# Patient Record
Sex: Female | Born: 1950 | Race: Black or African American | Hispanic: No | Marital: Married | Smoking: Never smoker
Health system: Southern US, Community
[De-identification: ages and names within clinical notes are randomized; demographics above are authoritative.]

## PROBLEM LIST (undated history)

## (undated) DIAGNOSIS — T8859XA Other complications of anesthesia, initial encounter: Secondary | ICD-10-CM

## (undated) DIAGNOSIS — I10 Essential (primary) hypertension: Secondary | ICD-10-CM

## (undated) DIAGNOSIS — R112 Nausea with vomiting, unspecified: Secondary | ICD-10-CM

## (undated) DIAGNOSIS — C50919 Malignant neoplasm of unspecified site of unspecified female breast: Secondary | ICD-10-CM

## (undated) DIAGNOSIS — Z923 Personal history of irradiation: Secondary | ICD-10-CM

## (undated) DIAGNOSIS — T4145XA Adverse effect of unspecified anesthetic, initial encounter: Secondary | ICD-10-CM

## (undated) DIAGNOSIS — H8109 Meniere's disease, unspecified ear: Secondary | ICD-10-CM

## (undated) DIAGNOSIS — Z9889 Other specified postprocedural states: Secondary | ICD-10-CM

## (undated) HISTORY — DX: Meniere's disease, unspecified ear: H81.09

## (undated) HISTORY — DX: Personal history of irradiation: Z92.3

## (undated) HISTORY — PX: BREAST SURGERY: SHX581

## (undated) HISTORY — PX: INNER EAR SURGERY: SHX679

## (undated) HISTORY — DX: Malignant neoplasm of unspecified site of unspecified female breast: C50.919

## (undated) HISTORY — PX: CHOLECYSTECTOMY: SHX55

## (undated) HISTORY — PX: APPENDECTOMY: SHX54

---

## 2011-10-18 ENCOUNTER — Other Ambulatory Visit: Payer: Self-pay | Admitting: Family Medicine

## 2011-10-18 DIAGNOSIS — R921 Mammographic calcification found on diagnostic imaging of breast: Secondary | ICD-10-CM

## 2011-10-25 ENCOUNTER — Ambulatory Visit
Admission: RE | Admit: 2011-10-25 | Discharge: 2011-10-25 | Disposition: A | Discharge status: Home / Self Care | Payer: BC Managed Care – PPO | Source: Ambulatory Visit | Attending: Family Medicine | Admitting: Family Medicine

## 2011-10-25 DIAGNOSIS — R921 Mammographic calcification found on diagnostic imaging of breast: Secondary | ICD-10-CM

## 2011-10-25 DIAGNOSIS — C50919 Malignant neoplasm of unspecified site of unspecified female breast: Secondary | ICD-10-CM

## 2011-10-25 HISTORY — DX: Malignant neoplasm of unspecified site of unspecified female breast: C50.919

## 2011-10-26 ENCOUNTER — Other Ambulatory Visit: Payer: Self-pay | Admitting: Family Medicine

## 2011-10-26 DIAGNOSIS — C50911 Malignant neoplasm of unspecified site of right female breast: Secondary | ICD-10-CM

## 2011-10-30 ENCOUNTER — Other Ambulatory Visit: Payer: Self-pay | Admitting: *Deleted

## 2011-10-30 ENCOUNTER — Telehealth: Payer: Self-pay | Admitting: *Deleted

## 2011-10-30 DIAGNOSIS — D0510 Intraductal carcinoma in situ of unspecified breast: Secondary | ICD-10-CM

## 2011-10-30 NOTE — Telephone Encounter (Signed)
Confirmed [facility] for 11/07/11 at 1215 .  Instructions and contact information given.  

## 2011-11-02 ENCOUNTER — Ambulatory Visit
Admission: RE | Admit: 2011-11-02 | Discharge: 2011-11-02 | Disposition: A | Discharge status: Home / Self Care | Payer: BC Managed Care – PPO | Source: Ambulatory Visit | Attending: Family Medicine | Admitting: Family Medicine

## 2011-11-02 DIAGNOSIS — C50911 Malignant neoplasm of unspecified site of right female breast: Secondary | ICD-10-CM

## 2011-11-02 MED ORDER — GADOBENATE DIMEGLUMINE 529 MG/ML IV SOLN
15.0000 mL | Freq: Once | INTRAVENOUS | Status: AC | PRN
  Administered 2011-11-02: 15 mL via INTRAVENOUS

## 2011-11-07 ENCOUNTER — Ambulatory Visit: Payer: BC Managed Care – PPO

## 2011-11-07 ENCOUNTER — Encounter: Payer: Self-pay | Admitting: Oncology

## 2011-11-07 ENCOUNTER — Encounter (INDEPENDENT_AMBULATORY_CARE_PROVIDER_SITE_OTHER): Payer: Self-pay | Admitting: General Surgery

## 2011-11-07 ENCOUNTER — Ambulatory Visit (HOSPITAL_BASED_OUTPATIENT_CLINIC_OR_DEPARTMENT_OTHER): Payer: BC Managed Care – PPO | Admitting: General Surgery

## 2011-11-07 ENCOUNTER — Telehealth: Payer: Self-pay | Admitting: Oncology

## 2011-11-07 ENCOUNTER — Other Ambulatory Visit (HOSPITAL_BASED_OUTPATIENT_CLINIC_OR_DEPARTMENT_OTHER): Payer: BC Managed Care – PPO | Admitting: Lab

## 2011-11-07 ENCOUNTER — Ambulatory Visit
Admission: RE | Admit: 2011-11-07 | Discharge: 2011-11-07 | Disposition: A | Discharge status: Home / Self Care | Payer: BC Managed Care – PPO | Source: Ambulatory Visit | Attending: Radiation Oncology | Admitting: Radiation Oncology

## 2011-11-07 ENCOUNTER — Ambulatory Visit (HOSPITAL_BASED_OUTPATIENT_CLINIC_OR_DEPARTMENT_OTHER): Payer: BC Managed Care – PPO | Admitting: Oncology

## 2011-11-07 ENCOUNTER — Ambulatory Visit: Payer: BC Managed Care – PPO | Attending: General Surgery | Admitting: Physical Therapy

## 2011-11-07 VITALS — BP 164/104 | HR 64 | Temp 98.3°F | Ht 64.0 in | Wt 174.0 lb

## 2011-11-07 DIAGNOSIS — C50419 Malignant neoplasm of upper-outer quadrant of unspecified female breast: Secondary | ICD-10-CM

## 2011-11-07 DIAGNOSIS — D0590 Unspecified type of carcinoma in situ of unspecified breast: Secondary | ICD-10-CM | POA: Insufficient documentation

## 2011-11-07 DIAGNOSIS — D0510 Intraductal carcinoma in situ of unspecified breast: Secondary | ICD-10-CM

## 2011-11-07 DIAGNOSIS — Z510 Encounter for antineoplastic radiation therapy: Secondary | ICD-10-CM | POA: Insufficient documentation

## 2011-11-07 DIAGNOSIS — Z170 Estrogen receptor positive status [ER+]: Secondary | ICD-10-CM | POA: Insufficient documentation

## 2011-11-07 DIAGNOSIS — Z79899 Other long term (current) drug therapy: Secondary | ICD-10-CM | POA: Insufficient documentation

## 2011-11-07 DIAGNOSIS — Z803 Family history of malignant neoplasm of breast: Secondary | ICD-10-CM | POA: Insufficient documentation

## 2011-11-07 DIAGNOSIS — C50411 Malignant neoplasm of upper-outer quadrant of right female breast: Secondary | ICD-10-CM | POA: Diagnosis present

## 2011-11-07 DIAGNOSIS — Z800 Family history of malignant neoplasm of digestive organs: Secondary | ICD-10-CM | POA: Insufficient documentation

## 2011-11-07 DIAGNOSIS — C50519 Malignant neoplasm of lower-outer quadrant of unspecified female breast: Secondary | ICD-10-CM

## 2011-11-07 DIAGNOSIS — C50919 Malignant neoplasm of unspecified site of unspecified female breast: Secondary | ICD-10-CM | POA: Insufficient documentation

## 2011-11-07 DIAGNOSIS — IMO0001 Reserved for inherently not codable concepts without codable children: Secondary | ICD-10-CM | POA: Insufficient documentation

## 2011-11-07 LAB — COMPREHENSIVE METABOLIC PANEL
Albumin: 3.7 g/dL (ref 3.5–5.2)
BUN: 12 mg/dL (ref 6–23)
Calcium: 9.3 mg/dL (ref 8.4–10.5)
Chloride: 103 mEq/L (ref 96–112)
Creatinine, Ser: 0.72 mg/dL (ref 0.50–1.10)
Glucose, Bld: 89 mg/dL (ref 70–99)
Potassium: 3.3 mEq/L — ABNORMAL LOW (ref 3.5–5.3)

## 2011-11-07 LAB — CBC WITH DIFFERENTIAL/PLATELET
Basophils Absolute: 0 10*3/uL (ref 0.0–0.1)
Eosinophils Absolute: 0.2 10*3/uL (ref 0.0–0.5)
HCT: 40 % (ref 34.8–46.6)
HGB: 13.6 g/dL (ref 11.6–15.9)
MCH: 29.3 pg (ref 25.1–34.0)
MCV: 86.4 fL (ref 79.5–101.0)
MONO%: 6.7 % (ref 0.0–14.0)
NEUT#: 5.4 10*3/uL (ref 1.5–6.5)
NEUT%: 55 % (ref 38.4–76.8)
RDW: 13.8 % (ref 11.2–14.5)
lymph#: 3.5 10*3/uL — ABNORMAL HIGH (ref 0.9–3.3)

## 2011-11-07 NOTE — Telephone Encounter (Signed)
pt rtn call and confirm appts for 11/30 and 12/06 °

## 2011-11-07 NOTE — Progress Notes (Signed)
Patient ID: Penny Foster, female   DOB: Sep 07, 1951, 60 y.o.   MRN: [id number]  Chief Complaint  Patient presents with  . Other    breast cancer    HPI Penny Foster is a 60 y.o. female.  Referred by Dr. Christiana Pellant HPI This is a 48 yof with no specific breast complaints who underwent a screening mammogram recently. This showed developing heterogeneous calcifications in the upper outer right breast.  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  This is ER positive at 99% and PR positive at 88%.  MRI showed 2 cm region of clumped nodular enhancement in the right breast at nine o'clock extending posteriorly.  She comes in today to discuss options.  Past Medical History  Diagnosis Date  . Meniere disease     Past Surgical History  Procedure Date  . Inner ear surgery     History reviewed. No pertinent family history.  Social History History  Substance Use Topics  . Smoking status: Never Smoker   . Smokeless tobacco: Not on file  . Alcohol Use: No    No Known Allergies  Current Outpatient Prescriptions  Medication Sig Dispense Refill  . diltiazem (DILACOR XR) 180 MG 24 hr capsule       . hydrochlorothiazide (HYDRODIURIL) 25 MG tablet         Review of Systems Review of Systems  Constitutional: Negative for fever, chills and unexpected weight change.  HENT: Negative for hearing loss, congestion, sore throat, trouble swallowing and voice change.   Eyes: Negative for visual disturbance.  Respiratory: Negative for cough and wheezing.   Cardiovascular: Negative for chest pain, palpitations and leg swelling.  Gastrointestinal: Negative for nausea, vomiting, abdominal pain, diarrhea, constipation, blood in stool, abdominal distention and anal bleeding.  Genitourinary: Negative for hematuria, vaginal bleeding and difficulty urinating.  Musculoskeletal: Negative for arthralgias.  Skin: Negative for rash and wound.  Neurological:  Negative for seizures, syncope and headaches.  Hematological: Negative for adenopathy. Does not bruise/bleed easily.  Psychiatric/Behavioral: Negative for confusion.    There were no vitals taken for this visit.  Physical Exam Physical Exam  Constitutional: She appears well-developed and well-nourished.  Neck: Neck supple.  Cardiovascular: Normal rate, regular rhythm and normal heart sounds.   Pulmonary/Chest: Effort normal and breath sounds normal. She has no wheezes. She has no rales. Right breast exhibits no inverted nipple, no mass, no nipple discharge, no skin change and no tenderness. Left breast exhibits no inverted nipple, no mass, no nipple discharge, no skin change and no tenderness. Breasts are symmetrical.  Abdominal: Soft. There is no hepatomegaly.  Lymphadenopathy:    She has no cervical adenopathy.    She has no axillary adenopathy.       Right: No supraclavicular adenopathy present.       Left: No supraclavicular adenopathy present.    Data Reviewed BILATERAL BREAST MRI WITH AND WITHOUT CONTRAST  Technique: Multiplanar, multisequence MR images of both breasts  were obtained prior to and following the intravenous administration  of 15ml of Multihance. Three dimensional images were evaluated at  the independent DynaCad workstation.  Comparison: [facility] mammograms and post biopsy right  diagnostic mammogram at [facility]  Findings: No abnormal T2-weighted hyperintensity or lymphadenopathy  is seen on either side. At the site of biopsy-proven right breast  nine o'clock location DCIS, with internal clip artifact, there are  post biopsy changes with smooth rim enhancement of the biopsy  tract.  There is clumped nodular enhancement extending 2 cm  posterior to the clip artifact, demonstrating plateau type  enhancement kinetics. This abnormal enhancement corresponds to  mammographically evident calcifications extending posterior to the    bullet shaped biopsy clip. No other area of abnormal enhancement  is seen on either side. There is minimal background parenchymal  enhancement type pattern.  IMPRESSION:  2 cm region of abnormal clumped nodular enhancement in the right  breast nine o'clock location at the site of biopsy-proven DCIS,  extending posteriorly from the biopsy clip and corresponding to  mammographically evident calcifications. Attention to this  morphology is recommended at the time of needle localization, with  possible bracketing.  No other area of abnormal enhancement to suggest  multifocal/multicentric or contralateral malignancy.    Assessment    Right breast DCIS    Plan     We discussed the staging and pathophysiology of breast cancer. We discussed all of the different options for treatment for breast cancer including surgery, chemotherapy, radiation therapy, Herceptin, and antiestrogen therapy.   We discussed a sentinel lymph node biopsy as she has some high risk features including comedo necrosis and high grade.  But this does look about 2 cm.  She is right handed and we discussed possibility of node biopsy. After a long discussion we have decided to not pursue sentinel node biopsy at this time.  She understands that she may need to return to operating room if any invasive disease on her specimen. We discussed the options for treatment of the breast cancer which included lumpectomy versus a mastectomy. We discussed the performance of the lumpectomy with a wire placement. We discussed a 10-20% chance of a positive margin requiring reexcision in the operating room. We also discussed that she may need radiation therapy or antiestrogen therapy or both if she undergoes lumpectomy. We discussed the mastectomy and the postoperative care for that as well. We discussed that there is no difference in her survival whether she undergoes lumpectomy with radiation therapy or antiestrogen therapy versus a mastectomy.  There is a slight difference in the local recurrence rate being 3-5% with lumpectomy and about 1% with a mastectomy. We discussed the risks of operation including bleeding, infection, possible reoperation. She understands her further therapy will be based on what her stages at the time of her operation. After discussion, plan for wire guided lumpectomy.          Shaarav Ripple 11/07/2011, 4:43 PM

## 2011-11-07 NOTE — Progress Notes (Signed)
Penny Foster    CC:  Penny Foster is a 60 year old [place] woman referred by Dr. Christiana Foster for evaluation in the setting of newly diagnosed breast cancer.  HPI: The patient had a screening mammogram at [facility] in October 2012 showing new calcifications in the right breast. Repeat right diagnostic mammography on 10/04/2011 showed a cluster of heterogeneous calcifications spanning 2.4 cm. 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  With this information the patient was referred for bilateral breast MRI, performed 10/31/2011. This showed a 2 cm area of abnormal clumped nodular enhancement in the right breast 9:00 location extending posteriorly from the biopsy clip. There were no other areas of abnormal enhancement and no evidence of lymph node involvement.  The patient's case was presented this morning at the breast cancer conference, and was seen today at the multidisciplinary breast cancer clinic to develop a definitive treatment plan.  Interval History:  Past Medical History  Diagnosis Date  . Meniere disease     Past Surgical History  Procedure Date  . Inner ear surgery     Family History: The patient's father died at the age of 25; her mother died at the age of 30. She has 4 brothers and 5 sisters. One sister had stomach cancer, one brother had lung cancer. There is no history of breast or ovarian cancer in the family.  Gynecologic history: Menarche age 62, first pregnancy to term age 24. She is GX P2. She does not recall how long ago she stopped having periods. She never used hormone replacement or birth control pills.  Social History: She works as Research officer, political party. Her husband Penny Foster is a retired Art gallery manager although he a rose to be a Warehouse manager of the  company. Son Penny Foster lives in [place] where he works in [employer]. Daughter Penny Foster is Interior and spatial designer of the [employer] [employer] [facility] group home for disabled adults program. The patient has one grandchild. She attends the [employer],  Health maintenance: The patient  reports that she has never smoked. She does not have any smokeless tobacco history on file. She reports that she does not drink alcohol. Her drug history not on file.    Cholesterol --  Bone density -- performed 2007; pt. Will bring records next visit  Colonoscopy --never  (PAP) OCT 2012   Allergies: No Known Allergies     Current Outpatient Prescriptions  Medication Sig Dispense Refill  . diltiazem (DILACOR XR) 180 MG 24 hr capsule       . hydrochlorothiazide (HYDRODIURIL) 25 MG tablet         ROS she was not aware of any symptoms prior to her mammogram that might suggest breast cancer. A detailed review of systems today was otherwise entirely negative.  Physical Exam: She is a middle-aged [place] American woman who appears comfortable.  Blood pressure 164/104, pulse 64, temperature 98.3 F (36.8 C), temperature source Oral, height 5\' 4"  (1.626 m), weight 174 lb (78.926 kg). BMI is 29.9.  Sclerae unicteric Oropharynx clear No peripheral adenopathy and specifically no right axillary adenopathy palpable Lungs no rales or rhonchi Heart regular rate and rhythm Abd benign MSK no focal spinal tenderness, no peripheral edema Neuro: nonfocal Breasts: Right breast status post recent lumpectomy. There are no significant skin changes or nipple retraction. Left breast no suspicious  masses  LABS   Results for orders placed in visit on 11/07/11 (from the past 48 hour(s))  CBC WITH DIFFERENTIAL     Status: Abnormal   Collection Time   11/07/11 12:13 PM      Component Value Range Comment   WBC 9.8  3.9 - 10.3 (10e3/uL)    NEUT# 5.4  1.5 - 6.5 (10e3/uL)    HGB 13.6  11.6 - 15.9 (g/dL)    HCT 16.1  09.6 - 04.5 (%)     Platelets 325  145 - 400 (10e3/uL)    MCV 86.4  79.5 - 101.0 (fL)    MCH 29.3  25.1 - 34.0 (pg)    MCHC 33.9  31.5 - 36.0 (g/dL)    RBC 4.09  8.11 - 9.14 (10e6/uL)    RDW 13.8  11.2 - 14.5 (%)    lymph# 3.5 (*) 0.9 - 3.3 (10e3/uL)    MONO# 0.7  0.1 - 0.9 (10e3/uL)    Eosinophils Absolute 0.2  0.0 - 0.5 (10e3/uL)    Basophils Absolute 0.0  0.0 - 0.1 (10e3/uL)    NEUT% 55.0  38.4 - 76.8 (%)    LYMPH% 36.2  14.0 - 49.7 (%)    MONO% 6.7  0.0 - 14.0 (%)    EOS% 1.6  0.0 - 7.0 (%)    BASO% 0.5  0.0 - 2.0 (%)   COMPREHENSIVE METABOLIC PANEL     Status: Abnormal   Collection Time   11/07/11 12:13 PM      Component Value Range Comment   Sodium 142  135 - 145 (mEq/L)    Potassium 3.3 (*) 3.5 - 5.3 (mEq/L)    Chloride 103  96 - 112 (mEq/L)    CO2 29  19 - 32 (mEq/L)    Glucose, Bld 89  70 - 99 (mg/dL)    BUN 12  6 - 23 (mg/dL)    Creatinine, Ser 7.82  0.50 - 1.10 (mg/dL)    Total Bilirubin 0.3  0.3 - 1.2 (mg/dL)    Alkaline Phosphatase 81  39 - 117 (U/L)    AST 17  0 - 37 (U/L)    ALT 16  0 - 35 (U/L)    Total Protein 7.9  6.0 - 8.3 (g/dL)    Albumin 3.7  3.5 - 5.2 (g/dL)    Calcium 9.3  8.4 - 10.5 (mg/dL)        FILMS:  No results found.   Assessment: 60 year old [name] woman status post right breast biopsy 10/25/2011 for a high-grade ductal carcinoma in situ which was strongly estrogen and progesterone receptor positive.    Plan: We discussed her situation at length, and she understands that a noninvasive tumor is by definition not life threatening. She understands accordingly that there is no survival difference between lumpectomy plus radiation and mastectomy. She is very interested in breast preservation, and that is the current plan, with surgery anticipated for next week.  Postoperative radiation will decrease the risk of local recurrence. Antiestrogen therapy will further lower the risk of local recurrence. It will also cut in half the risk of a new  breast cancer developing in either breast. For these reasons of I. feel comfortable recommending 5 years of antiestrogen therapy to Ms. Goatley.  We discussed at length the B-43 study. She is not motivated by experimental treatments and does not desire to pursue this further.  I have made her a return appointment to see me in mid February.  By then she should be done with her radiation and we should be able to discuss antiestrogen therapy. I have asked her to bring a copy of her bone density for review. She knows to call for any problems that may develop before the next visit.  MAGRINAT,GUSTAV C 11/07/2011, 3:49 PM

## 2011-11-07 NOTE — Patient Instructions (Signed)
Lumpectomy, Breast Conserving Surgery Care After Please read the instructions outlined below and refer to this sheet in the next few weeks. These discharge instructions provide you with general information on caring for yourself after you leave the hospital. Your surgeon may also give you specific instructions. While your treatment has been planned according to the most current medical practices available, unavoidable complications occasionally occur. If you have any problems or questions after discharge, please call your surgeon. Reasons for a lumpectomy:  Any solid breast mass.   Grouped significant nodularity that may be confused with a solitary breast mass.  AFTER THE PROCEDURE  After surgery, you will be taken to the recovery area where a nurse will watch and check your progress. Once you're awake, stable, and taking fluids well, barring other problems you will be allowed to go home.   Ice packs applied to your operative site may help with discomfort and keep the swelling down.   A small rubber drain may be placed in the incision for a couple of days to prevent a hematoma in the breast.   A pressure dressing may be applied for 24 to 48 hours to prevent bleeding.   Keep the wound dry.   You may resume a normal diet and activities as directed. Avoid strenuous activities affecting the arm on the side of the biopsy site such as tennis, swimming, heavy lifting (more than 10 pounds) or pulling.   Bruising in the breast is normal following this procedure.   Wearing a bra - even to bed - may be more comfortable and also help keep the dressing on.   Change dressings as directed.   Only take over-the-counter or prescription medicines for pain, discomfort, or fever as directed by your caregiver.  Call for your results as instructed by your surgeon. Remember it isyour responsibility to get the results of your lumpectomy if your surgeon asked you to follow-up. Do not assume everything is fine if  you have not heard from your caregiver. SEEK MEDICAL CARE IF:   There is increased bleeding (more than a small spot) from the wound.   You notice redness, swelling, or increasing pain in the wound.   Pus is coming from wound.   An unexplained oral temperature above 102 F (38.9 C) develops.   You notice a foul smell coming from the wound or dressing.  SEEK IMMEDIATE MEDICAL CARE IF:   You develop a rash.   You have difficulty breathing.   You have any allergic problems.  Document Released: 12/12/2006 Document Revised: 08/08/2011 Document Reviewed: 11/14/2007 [facility] Patient Information 2012 [place], [place].

## 2011-11-08 ENCOUNTER — Other Ambulatory Visit (INDEPENDENT_AMBULATORY_CARE_PROVIDER_SITE_OTHER): Payer: Self-pay | Admitting: General Surgery

## 2011-11-08 ENCOUNTER — Encounter: Payer: Self-pay | Admitting: Radiation Oncology

## 2011-11-08 ENCOUNTER — Other Ambulatory Visit (INDEPENDENT_AMBULATORY_CARE_PROVIDER_SITE_OTHER): Payer: Self-pay

## 2011-11-08 ENCOUNTER — Telehealth (INDEPENDENT_AMBULATORY_CARE_PROVIDER_SITE_OTHER): Payer: Self-pay

## 2011-11-08 DIAGNOSIS — C50411 Malignant neoplasm of upper-outer quadrant of right female breast: Secondary | ICD-10-CM | POA: Diagnosis present

## 2011-11-08 DIAGNOSIS — Z170 Estrogen receptor positive status [ER+]: Secondary | ICD-10-CM | POA: Diagnosis present

## 2011-11-08 NOTE — Telephone Encounter (Signed)
LMOM notifying pt my name and #of [facility] if pt has anymore questions before surgery./ [facility]

## 2011-11-08 NOTE — Progress Notes (Signed)
CC:   Penny Foster, M.D. Juanetta Gosling, MD Harrel Lemon, MD  REFERRING PHYSICIAN:  Harrel Lemon, MD  REFERRING PHYSICIAN:  Christiana Pellant, MD.  DIAGNOSIS:  Ductal carcinoma in situ of the right breast.  HISTORY OF PRESENT ILLNESS:  The patient is a 60 year old female who was noted to have some new worrisome calcifications within the right breast on a recent screening mammogram.  A diagnostic mammogram was subsequently performed that showed a cluster of heterogeneous calcifications spanning approximately 2.4 cm.  A biopsy was performed on 10/25/2011, and this did return positive for high-grade DCIS.  The receptor studies have indicated that the tumor is ER positive, PR positive and HER-2/neu negative.  She then proceeded to undergo a bilateral breast MRI scan performed on 10/31/2011.  This showed a 2 cm area of abnormal enhancement within the right breast at the 9 o'clock position.  The clip was at the anterior aspect of this, and the tumor, therefore, appear to extend posteriorly by about 2 cm.  There were no other suspicious findings in either breast and no suspicious lymph node findings on either side.  Therefore, the patient is seen today in multidisciplinary breast clinic to come up with a definitive treatment plan, and I am seeing her for consideration of radiation treatment.  The patient's case was also discussed at multidisciplinary breast conference this morning.  PAST MEDICAL HISTORY:  Meniere's disease, status post inner ear surgery.  MEDICATIONS:  Diltiazem and hydrochlorothiazide.  ALLERGIES:  No known drug allergies.  FAMILY HISTORY:  No known family history of breast malignancy.  She has 1 sister with stomach cancer and a brother with breast cancer.  SOCIAL HISTORY:  The patient works as an Research officer, political party.  She is married and has 2 children.  The patient lives in [place].  She has no history of smoking or alcohol  abuse.  REVIEW OF SYSTEMS:  Fully reviewed and documented in the medical chart.  PHYSICAL EXAM:  Vital Signs:  Weight 174 pounds.  Blood pressure 164/104, pulse 64, respiratory rate 20, temperature 98.3.  General:  A well-developed female in no acute distress, alert and oriented x3. HEENT:  Normocephalic atraumatic.  Extraocular movements intact.  Neck: Supple without any lymphadenopathy.  Cardiovascular:  Regular rate and rhythm.  Respiratory:  Clear to auscultation bilaterally.  Breasts:  No palpable adenopathy on either side and no significant skin changes or masses within either breast.  GI:  Abdomen soft, nontender.  Normal bowel sounds.  Extremities:  No edema present.  Neurologic:  No focal deficits.  IMPRESSION AND PLAN:  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  I believe that the patient is a good candidate for a lumpectomy to be followed by adjuvant radiation.  I have discussed with her an expected 6-1/2-week course of postoperative radiation, therefore, and all of her questions were answered.  We did discuss the possible side effects, risks and benefits of treatment.  The patient is not apparently interested in the B43 study.  Therefore, I look forward to seeing [name] approximately 2-3 weeks after her surgery to reassess her at that time and to help coordinate an anticipated course of adjuvant radiation.    ______________________________ Radene Gunning, M.D., Ph.D. JSM/MEDQ  D:  11/08/2011  T:  11/08/2011  Job:  [id number]

## 2011-11-09 ENCOUNTER — Other Ambulatory Visit (INDEPENDENT_AMBULATORY_CARE_PROVIDER_SITE_OTHER): Payer: Self-pay | Admitting: General Surgery

## 2011-11-09 ENCOUNTER — Other Ambulatory Visit (INDEPENDENT_AMBULATORY_CARE_PROVIDER_SITE_OTHER): Payer: Self-pay

## 2011-11-09 DIAGNOSIS — C50919 Malignant neoplasm of unspecified site of unspecified female breast: Secondary | ICD-10-CM

## 2011-11-13 ENCOUNTER — Encounter (HOSPITAL_BASED_OUTPATIENT_CLINIC_OR_DEPARTMENT_OTHER): Payer: Self-pay | Admitting: *Deleted

## 2011-11-13 NOTE — Progress Notes (Signed)
To come in for ekg,cxr,bmet Arrive dos after nl

## 2011-11-14 ENCOUNTER — Encounter (HOSPITAL_BASED_OUTPATIENT_CLINIC_OR_DEPARTMENT_OTHER)
Admission: RE | Admit: 2011-11-14 | Discharge: 2011-11-14 | Disposition: A | Discharge status: Home / Self Care | Payer: BC Managed Care – PPO | Source: Ambulatory Visit | Attending: General Surgery | Admitting: General Surgery

## 2011-11-14 ENCOUNTER — Other Ambulatory Visit: Payer: Self-pay

## 2011-11-14 ENCOUNTER — Ambulatory Visit
Admission: RE | Admit: 2011-11-14 | Discharge: 2011-11-14 | Disposition: A | Discharge status: Home / Self Care | Payer: BC Managed Care – PPO | Source: Ambulatory Visit | Attending: General Surgery | Admitting: General Surgery

## 2011-11-14 LAB — BASIC METABOLIC PANEL
CO2: 27 mEq/L (ref 19–32)
Chloride: 102 mEq/L (ref 96–112)
GFR calc Af Amer: >90 mL/min (ref >90)
Potassium: 3.2 mEq/L — ABNORMAL LOW (ref 3.5–5.1)

## 2011-11-15 ENCOUNTER — Encounter (HOSPITAL_BASED_OUTPATIENT_CLINIC_OR_DEPARTMENT_OTHER): Payer: Self-pay | Admitting: Certified Registered Nurse Anesthetist

## 2011-11-15 ENCOUNTER — Ambulatory Visit (HOSPITAL_BASED_OUTPATIENT_CLINIC_OR_DEPARTMENT_OTHER)
Admission: RE | Admit: 2011-11-15 | Discharge: 2011-11-15 | Disposition: A | Discharge status: Home / Self Care | Payer: BC Managed Care – PPO | Source: Ambulatory Visit | Attending: General Surgery | Admitting: General Surgery

## 2011-11-15 ENCOUNTER — Encounter (HOSPITAL_BASED_OUTPATIENT_CLINIC_OR_DEPARTMENT_OTHER)
Admission: RE | Disposition: A | Discharge status: Home / Self Care | Payer: Self-pay | Source: Ambulatory Visit | Attending: General Surgery

## 2011-11-15 ENCOUNTER — Encounter (HOSPITAL_BASED_OUTPATIENT_CLINIC_OR_DEPARTMENT_OTHER): Payer: Self-pay | Admitting: *Deleted

## 2011-11-15 ENCOUNTER — Ambulatory Visit
Admission: RE | Admit: 2011-11-15 | Discharge: 2011-11-15 | Disposition: A | Discharge status: Home / Self Care | Payer: BC Managed Care – PPO | Source: Ambulatory Visit | Attending: General Surgery | Admitting: General Surgery

## 2011-11-15 ENCOUNTER — Other Ambulatory Visit (INDEPENDENT_AMBULATORY_CARE_PROVIDER_SITE_OTHER): Payer: Self-pay | Admitting: General Surgery

## 2011-11-15 ENCOUNTER — Ambulatory Visit (HOSPITAL_BASED_OUTPATIENT_CLINIC_OR_DEPARTMENT_OTHER): Payer: BC Managed Care – PPO | Admitting: Certified Registered Nurse Anesthetist

## 2011-11-15 DIAGNOSIS — Z01810 Encounter for preprocedural cardiovascular examination: Secondary | ICD-10-CM | POA: Insufficient documentation

## 2011-11-15 DIAGNOSIS — C50919 Malignant neoplasm of unspecified site of unspecified female breast: Secondary | ICD-10-CM

## 2011-11-15 DIAGNOSIS — I10 Essential (primary) hypertension: Secondary | ICD-10-CM | POA: Insufficient documentation

## 2011-11-15 DIAGNOSIS — C50519 Malignant neoplasm of lower-outer quadrant of unspecified female breast: Secondary | ICD-10-CM | POA: Insufficient documentation

## 2011-11-15 DIAGNOSIS — D0590 Unspecified type of carcinoma in situ of unspecified breast: Secondary | ICD-10-CM

## 2011-11-15 DIAGNOSIS — Z01812 Encounter for preprocedural laboratory examination: Secondary | ICD-10-CM | POA: Insufficient documentation

## 2011-11-15 HISTORY — DX: Other specified postprocedural states: Z98.890

## 2011-11-15 HISTORY — PX: BREAST LUMPECTOMY: SHX2

## 2011-11-15 HISTORY — DX: Nausea with vomiting, unspecified: R11.2

## 2011-11-15 HISTORY — DX: Adverse effect of unspecified anesthetic, initial encounter: T41.45XA

## 2011-11-15 HISTORY — DX: Other complications of anesthesia, initial encounter: T88.59XA

## 2011-11-15 HISTORY — DX: Essential (primary) hypertension: I10

## 2011-11-15 LAB — POCT HEMOGLOBIN-HEMACUE: Hemoglobin: 11.6 g/dL — ABNORMAL LOW (ref 12.0–15.0)

## 2011-11-15 SURGERY — BREAST LUMPECTOMY
Anesthesia: General | Site: Breast | Laterality: Right | Wound class: Clean

## 2011-11-15 MED ORDER — DEXAMETHASONE SODIUM PHOSPHATE 4 MG/ML IJ SOLN
INTRAMUSCULAR | Status: DC | PRN
  Administered 2011-11-15: 10 mg via INTRAVENOUS

## 2011-11-15 MED ORDER — MEPERIDINE HCL 25 MG/ML IJ SOLN: 6.2500 mg | INTRAMUSCULAR | Status: DC | PRN

## 2011-11-15 MED ORDER — CIPROFLOXACIN IN D5W 400 MG/200ML IV SOLN
400.0000 mg | INTRAVENOUS | Status: AC
  Administered 2011-11-15: 400 mg via INTRAVENOUS

## 2011-11-15 MED ORDER — PROMETHAZINE HCL 25 MG/ML IJ SOLN: 6.2500 mg | INTRAMUSCULAR | Status: DC | PRN

## 2011-11-15 MED ORDER — MIDAZOLAM HCL 5 MG/5ML IJ SOLN
INTRAMUSCULAR | Status: DC | PRN
  Administered 2011-11-15: 2 mg via INTRAVENOUS

## 2011-11-15 MED ORDER — BUPIVACAINE HCL (PF) 0.25 % IJ SOLN
INTRAMUSCULAR | Status: DC | PRN
  Administered 2011-11-15: 20 mL

## 2011-11-15 MED ORDER — PROPOFOL 10 MG/ML IV EMUL
INTRAVENOUS | Status: DC | PRN
  Administered 2011-11-15: 150 mg via INTRAVENOUS

## 2011-11-15 MED ORDER — LIDOCAINE HCL (CARDIAC) 20 MG/ML IV SOLN
INTRAVENOUS | Status: DC | PRN
  Administered 2011-11-15: 40 mg via INTRAVENOUS

## 2011-11-15 MED ORDER — OXYCODONE-ACETAMINOPHEN 5-325 MG PO TABS: 1.0000 | ORAL_TABLET | ORAL | Status: AC | PRN

## 2011-11-15 MED ORDER — ACETAMINOPHEN 10 MG/ML IV SOLN
1000.0000 mg | Freq: Once | INTRAVENOUS | Status: AC
  Administered 2011-11-15: 1000 mg via INTRAVENOUS

## 2011-11-15 MED ORDER — ONDANSETRON HCL 4 MG/2ML IJ SOLN
INTRAMUSCULAR | Status: DC | PRN
  Administered 2011-11-15: 4 mg via INTRAVENOUS

## 2011-11-15 MED ORDER — LACTATED RINGERS IV SOLN
INTRAVENOUS | Status: DC
  Administered 2011-11-15 (×2): via INTRAVENOUS

## 2011-11-15 MED ORDER — SCOPOLAMINE 1 MG/3DAYS TD PT72
1.0000 | MEDICATED_PATCH | TRANSDERMAL | Status: DC
  Administered 2011-11-15: 1.5 mg via TRANSDERMAL

## 2011-11-15 MED ORDER — FENTANYL CITRATE 0.05 MG/ML IJ SOLN
INTRAMUSCULAR | Status: DC | PRN
  Administered 2011-11-15: 25 ug via INTRAVENOUS
  Administered 2011-11-15 (×2): 50 ug via INTRAVENOUS
  Administered 2011-11-15: 25 ug via INTRAVENOUS

## 2011-11-15 MED ORDER — HYDROMORPHONE HCL PF 1 MG/ML IJ SOLN: 0.2500 mg | INTRAMUSCULAR | Status: DC | PRN

## 2011-11-15 SURGICAL SUPPLY — 50 items
APPLIER CLIP 9.375 MED OPEN (MISCELLANEOUS) ×2
BENZOIN TINCTURE PRP APPL 2/3 (GAUZE/BANDAGES/DRESSINGS) ×2 IMPLANT
BINDER BREAST LRG (GAUZE/BANDAGES/DRESSINGS) IMPLANT
BINDER BREAST MEDIUM (GAUZE/BANDAGES/DRESSINGS) IMPLANT
BINDER BREAST XLRG (GAUZE/BANDAGES/DRESSINGS) ×2 IMPLANT
BINDER BREAST XXLRG (GAUZE/BANDAGES/DRESSINGS) IMPLANT
BLADE SURG 15 STRL LF DISP TIS (BLADE) ×1 IMPLANT
BLADE SURG 15 STRL SS (BLADE) ×1
CANISTER SUCTION 1200CC (MISCELLANEOUS) IMPLANT
CHLORAPREP W/TINT 26ML (MISCELLANEOUS) ×2 IMPLANT
CLIP APPLIE 9.375 MED OPEN (MISCELLANEOUS) ×1 IMPLANT
CLOTH BEACON ORANGE TIMEOUT ST (SAFETY) ×2 IMPLANT
COVER MAYO STAND STRL (DRAPES) ×2 IMPLANT
COVER TABLE BACK 60X90 (DRAPES) ×2 IMPLANT
DECANTER SPIKE VIAL GLASS SM (MISCELLANEOUS) IMPLANT
DERMABOND ADVANCED (GAUZE/BANDAGES/DRESSINGS)
DERMABOND ADVANCED .7 DNX12 (GAUZE/BANDAGES/DRESSINGS) IMPLANT
DEVICE DUBIN W/COMP PLATE 8390 (MISCELLANEOUS) ×2 IMPLANT
DRAPE PED LAPAROTOMY (DRAPES) ×2 IMPLANT
DRSG TEGADERM 4X4.75 (GAUZE/BANDAGES/DRESSINGS) ×2 IMPLANT
ELECT COATED BLADE 2.86 ST (ELECTRODE) ×2 IMPLANT
ELECT REM PT RETURN 9FT ADLT (ELECTROSURGICAL) ×2
ELECTRODE REM PT RTRN 9FT ADLT (ELECTROSURGICAL) ×1 IMPLANT
GAUZE SPONGE 4X4 12PLY STRL LF (GAUZE/BANDAGES/DRESSINGS) ×2 IMPLANT
GLOVE BIO SURGEON STRL SZ7 (GLOVE) ×6 IMPLANT
GLOVE BIOGEL PI IND STRL 7.5 (GLOVE) ×1 IMPLANT
GLOVE BIOGEL PI INDICATOR 7.5 (GLOVE) ×1
GLOVE INDICATOR 7.0 STRL GRN (GLOVE) ×2 IMPLANT
GOWN PREVENTION PLUS XLARGE (GOWN DISPOSABLE) ×4 IMPLANT
KIT MARKER MARGIN INK (KITS) ×2 IMPLANT
NEEDLE HYPO 25X1 1.5 SAFETY (NEEDLE) ×2 IMPLANT
NS IRRIG 1000ML POUR BTL (IV SOLUTION) IMPLANT
PACK BASIN DAY SURGERY FS (CUSTOM PROCEDURE TRAY) ×2 IMPLANT
PENCIL BUTTON HOLSTER BLD 10FT (ELECTRODE) ×2 IMPLANT
SLEEVE SCD COMPRESS KNEE MED (MISCELLANEOUS) ×2 IMPLANT
SPONGE LAP 4X18 X RAY DECT (DISPOSABLE) ×2 IMPLANT
STRIP CLOSURE SKIN 1/2X4 (GAUZE/BANDAGES/DRESSINGS) ×2 IMPLANT
SUT MNCRL AB 4-0 PS2 18 (SUTURE) ×2 IMPLANT
SUT SILK 2 0 SH (SUTURE) ×2 IMPLANT
SUT VIC AB 2-0 SH 27 (SUTURE) ×1
SUT VIC AB 2-0 SH 27XBRD (SUTURE) ×1 IMPLANT
SUT VIC AB 3-0 SH 27 (SUTURE) ×1
SUT VIC AB 3-0 SH 27X BRD (SUTURE) ×1 IMPLANT
SUT VICRYL AB 3 0 TIES (SUTURE) IMPLANT
SYR CONTROL 10ML LL (SYRINGE) ×2 IMPLANT
TOWEL OR 17X24 6PK STRL BLUE (TOWEL DISPOSABLE) ×2 IMPLANT
TOWEL OR NON WOVEN STRL DISP B (DISPOSABLE) ×2 IMPLANT
TUBE CONNECTING 20X1/4 (TUBING) IMPLANT
WATER STERILE IRR 1000ML POUR (IV SOLUTION) IMPLANT
YANKAUER SUCT BULB TIP NO VENT (SUCTIONS) IMPLANT

## 2011-11-15 NOTE — H&P (View-Only) (Signed)
Patient ID: Penny Foster, female   DOB: 12/26/1950, 60 y.o.   MRN: [id number]  Chief Complaint  Patient presents with  . Other    breast cancer    HPI Penny Foster is a 60 y.o. female.  Referred by Dr. Gretchen Green HPI This is a 60 yof with no specific breast complaints who underwent a screening mammogram recently. This showed developing heterogeneous calcifications in the upper outer right breast.  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  This is ER positive at 99% and PR positive at 88%.  MRI showed 2 cm region of clumped nodular enhancement in the right breast at nine o'clock extending posteriorly.  She comes in today to discuss options.  Past Medical History  Diagnosis Date  . Meniere disease     Past Surgical History  Procedure Date  . Inner ear surgery     History reviewed. No pertinent family history.  Social History History  Substance Use Topics  . Smoking status: Never Smoker   . Smokeless tobacco: Not on file  . Alcohol Use: No    No Known Allergies  Current Outpatient Prescriptions  Medication Sig Dispense Refill  . diltiazem (DILACOR XR) 180 MG 24 hr capsule       . hydrochlorothiazide (HYDRODIURIL) 25 MG tablet         Review of Systems Review of Systems  Constitutional: Negative for fever, chills and unexpected weight change.  HENT: Negative for hearing loss, congestion, sore throat, trouble swallowing and voice change.   Eyes: Negative for visual disturbance.  Respiratory: Negative for cough and wheezing.   Cardiovascular: Negative for chest pain, palpitations and leg swelling.  Gastrointestinal: Negative for nausea, vomiting, abdominal pain, diarrhea, constipation, blood in stool, abdominal distention and anal bleeding.  Genitourinary: Negative for hematuria, vaginal bleeding and difficulty urinating.  Musculoskeletal: Negative for arthralgias.  Skin: Negative for rash and wound.  Neurological:  Negative for seizures, syncope and headaches.  Hematological: Negative for adenopathy. Does not bruise/bleed easily.  Psychiatric/Behavioral: Negative for confusion.    There were no vitals taken for this visit.  Physical Exam Physical Exam  Constitutional: She appears well-developed and well-nourished.  Neck: Neck supple.  Cardiovascular: Normal rate, regular rhythm and normal heart sounds.   Pulmonary/Chest: Effort normal and breath sounds normal. She has no wheezes. She has no rales. Right breast exhibits no inverted nipple, no mass, no nipple discharge, no skin change and no tenderness. Left breast exhibits no inverted nipple, no mass, no nipple discharge, no skin change and no tenderness. Breasts are symmetrical.  Abdominal: Soft. There is no hepatomegaly.  Lymphadenopathy:    She has no cervical adenopathy.    She has no axillary adenopathy.       Right: No supraclavicular adenopathy present.       Left: No supraclavicular adenopathy present.    Data Reviewed BILATERAL BREAST MRI WITH AND WITHOUT CONTRAST  Technique: Multiplanar, multisequence MR images of both breasts  were obtained prior to and following the intravenous administration  of 15ml of Multihance. Three dimensional images were evaluated at  the independent DynaCad workstation.  Comparison: [facility] mammograms and post biopsy right  diagnostic mammogram at [facility]  Findings: No abnormal T2-weighted hyperintensity or lymphadenopathy  is seen on either side. At the site of biopsy-proven right breast  nine o'clock location DCIS, with internal clip artifact, there are  post biopsy changes with smooth rim enhancement of the biopsy  tract.   There is clumped nodular enhancement extending 2 cm  posterior to the clip artifact, demonstrating plateau type  enhancement kinetics. This abnormal enhancement corresponds to  mammographically evident calcifications extending posterior to the    bullet shaped biopsy clip. No other area of abnormal enhancement  is seen on either side. There is minimal background parenchymal  enhancement type pattern.  IMPRESSION:  2 cm region of abnormal clumped nodular enhancement in the right  breast nine o'clock location at the site of biopsy-proven DCIS,  extending posteriorly from the biopsy clip and corresponding to  mammographically evident calcifications. Attention to this  morphology is recommended at the time of needle localization, with  possible bracketing.  No other area of abnormal enhancement to suggest  multifocal/multicentric or contralateral malignancy.    Assessment    Right breast DCIS    Plan     We discussed the staging and pathophysiology of breast cancer. We discussed all of the different options for treatment for breast cancer including surgery, chemotherapy, radiation therapy, Herceptin, and antiestrogen therapy.   We discussed a sentinel lymph node biopsy as she has some high risk features including comedo necrosis and high grade.  But this does look about 2 cm.  She is right handed and we discussed possibility of node biopsy. After a long discussion we have decided to not pursue sentinel node biopsy at this time.  She understands that she may need to return to operating room if any invasive disease on her specimen. We discussed the options for treatment of the breast cancer which included lumpectomy versus a mastectomy. We discussed the performance of the lumpectomy with a wire placement. We discussed a 10-20% chance of a positive margin requiring reexcision in the operating room. We also discussed that she may need radiation therapy or antiestrogen therapy or both if she undergoes lumpectomy. We discussed the mastectomy and the postoperative care for that as well. We discussed that there is no difference in her survival whether she undergoes lumpectomy with radiation therapy or antiestrogen therapy versus a mastectomy.  There is a slight difference in the local recurrence rate being 3-5% with lumpectomy and about 1% with a mastectomy. We discussed the risks of operation including bleeding, infection, possible reoperation. She understands her further therapy will be based on what her stages at the time of her operation. After discussion, plan for wire guided lumpectomy.          Therron Sells 11/07/2011, 4:43 PM    

## 2011-11-15 NOTE — Transfer of Care (Signed)
Immediate Anesthesia Transfer of Care Note  Patient: Penny Foster  Procedure(s) Performed:  LUMPECTOMY - right breast wire guided lumpectomy , bracketed  Patient Location: PACU  Anesthesia Type: General  Level of Consciousness: awake, alert , oriented and patient cooperative  Airway & Oxygen Therapy: Patient Spontanous Breathing and Patient connected to face mask oxygen  Post-op Assessment: Report given to PACU RN, Post -op Vital signs reviewed and stable and Patient moving all extremities X 4  Post vital signs: Reviewed and stable  Complications: No apparent anesthesia complications

## 2011-11-15 NOTE — Anesthesia Procedure Notes (Signed)
Procedure Name: LMA Insertion Date/Time: 11/15/2011 11:32 AM Performed by: Solomiya Pascale D Pre-anesthesia Checklist: Patient identified, Emergency Drugs available, Suction available and Patient being monitored Patient Re-evaluated:Patient Re-evaluated prior to inductionOxygen Delivery Method: Circle System Utilized Preoxygenation: Pre-oxygenation with 100% oxygen Intubation Type: IV induction Ventilation: Mask ventilation without difficulty LMA: LMA inserted LMA Size: 4.0 Number of attempts: 1 Placement Confirmation: positive ETCO2 Tube secured with: Tape Dental Injury: Teeth and Oropharynx as per pre-operative assessment

## 2011-11-15 NOTE — Anesthesia Preprocedure Evaluation (Addendum)
Anesthesia Evaluation  Patient identified by MRN, date of birth, ID band Patient awake    Reviewed: Allergy & Precautions, H&P , NPO status , Patient's Chart, lab work & pertinent test results  History of Anesthesia Complications (+) PONV  Airway Mallampati: II TM Distance: >3 FB Neck ROM: full    Dental No notable dental hx. (+) Partial Upper and Partial Lower   Pulmonary neg pulmonary ROS,  clear to auscultation  Pulmonary exam normal       Cardiovascular hypertension, On Medications neg cardio ROS regular Normal    Neuro/Psych Negative Neurological ROS  Negative Psych ROS   GI/Hepatic negative GI ROS, Neg liver ROS,   Endo/Other  Negative Endocrine ROS  Renal/GU negative Renal ROS  Genitourinary negative   Musculoskeletal   Abdominal Normal abdominal exam  (+)   Peds  Hematology negative hematology ROS (+)   Anesthesia Other Findings   Reproductive/Obstetrics negative OB ROS                           Anesthesia Physical Anesthesia Plan  ASA: II  Anesthesia Plan: General   Post-op Pain Management:    Induction: Intravenous  Airway Management Planned: LMA  Additional Equipment:   Intra-op Plan:   Post-operative Plan: Extubation in OR  Informed Consent: I have reviewed the patients History and Physical, chart, labs and discussed the procedure including the risks, benefits and alternatives for the proposed anesthesia with the patient or authorized representative who has indicated his/her understanding and acceptance.     Plan Discussed with: CRNA  Anesthesia Plan Comments:         Anesthesia Quick Evaluation

## 2011-11-15 NOTE — Interval H&P Note (Signed)
History and Physical Interval Note:  11/15/2011 11:19 AM  Penny Foster  has presented today for surgery, with the diagnosis of right breast cancer  The various methods of treatment have been discussed with the patient and family. After consideration of risks, benefits and other options for treatment, the patient has consented to  Procedure(s): Right wire guided lumpectomy as a surgical intervention .  The patients' history has been reviewed, patient examined, no change in status, stable for surgery.  I have reviewed the patients' chart and labs.  Questions were answered to the patient's satisfaction.     Donnah Levert

## 2011-11-15 NOTE — Op Note (Signed)
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 Harden Mo Anesthesia: Gen. With LMA Estimated blood loss: Minimal Specimens: #1 right breast lumpectomy marked with paint kit #2 additional anterior margin marked short stitch superior long stitch lateral double stitch anterior #3 posterior margin double stitch deep, long stitch lateral, short stitch superior Complications: None Drains: None Sponge and needle count was correct x2 of operation  Indications: This is a 60 year old female with a right breast mammogram showed an abnormal mass as well as some calcifications. She underwent a core biopsy showing right breast ductal carcinoma in situ. She also underwent an MRI showing about a 2 cm area of nodular enhancement posterior to this. We discussed all of her options and multidisciplinary clinic and decided to perform a lumpectomy. She understands the risks and benefits of this procedure.  Procedure: She first had 2 wires placed to bracket the [facility]. I had these mammograms available for my review in the operating room. After informed consent she was then taken to the operating room. She was administered 1 g of intravenous cefazolin. She had sequential compression devices placed on her lower extremities prior to beginning the operation. She was then prepped and draped in standard sterile surgical fashion after undergoing general anesthesia with an LMA. Surgical timeout was performed.  I made a radial incision right around the 2 wires in the lower outer portion of her right breast. Cautery was used to then remove both wires as well as the surrounding breast tissue. A Faxitron mammogram was then taken which confirmed removal of both wires as well as the clip. I was concerned that the clip was close to the anterior margin so I did excise a small amount of  anterior margin that was right where the clip and the lesion were. I also excised the posterior margin so this is now the pectoralis muscle. Hemostasis was obtained. I placed 2 clips deep on the pectoralis muscle and one clip in each position around the cavity. I then closed this with 2-0 Vicryl 3-0 Vicryl and 4 Monocryl for the skin. I infiltrated 20 cc of quarter percent Marcaine throughout the cavity. Steri-Strips and sterile dressing were placed. A breast binder was also placed. She tolerated this well was extubated in the operative and transferred to recovery in stable condition

## 2011-11-15 NOTE — Anesthesia Postprocedure Evaluation (Signed)
  Anesthesia Post-op Note  Patient: Penny Foster  Procedure(s) Performed:  LUMPECTOMY - right breast wire guided lumpectomy , bracketed  Patient Location: PACU  Anesthesia Type: General  Level of Consciousness: sedated  Airway and Oxygen Therapy: Patient Spontanous Breathing and Patient connected to face mask oxygen  Post-op Pain: mild  Post-op Assessment: Post-op Vital signs reviewed, Patient's Cardiovascular Status Stable, Respiratory Function Stable, Patent Airway and No signs of Nausea or vomiting  Post-op Vital Signs: Reviewed and stable  Complications: No apparent anesthesia complications

## 2011-11-16 ENCOUNTER — Encounter (HOSPITAL_BASED_OUTPATIENT_CLINIC_OR_DEPARTMENT_OTHER): Payer: Self-pay | Admitting: General Surgery

## 2011-11-19 ENCOUNTER — Telehealth: Payer: Self-pay | Admitting: *Deleted

## 2011-11-19 NOTE — Telephone Encounter (Signed)
Spoke to pt concerning [facility] from 11/07/11.  Pt denies questions or concerns at this time.  Encourage pt to call with needs.  Received verbal understanding.  Contact information give.

## 2011-11-21 ENCOUNTER — Encounter: Payer: Self-pay | Admitting: *Deleted

## 2011-11-21 ENCOUNTER — Telehealth (INDEPENDENT_AMBULATORY_CARE_PROVIDER_SITE_OTHER): Payer: Self-pay

## 2011-11-21 ENCOUNTER — Other Ambulatory Visit (INDEPENDENT_AMBULATORY_CARE_PROVIDER_SITE_OTHER): Payer: Self-pay | Admitting: General Surgery

## 2011-11-21 DIAGNOSIS — C50919 Malignant neoplasm of unspecified site of unspecified female breast: Secondary | ICD-10-CM

## 2011-11-21 NOTE — Telephone Encounter (Signed)
Returned [employer] and she was worried about receiving info on going to the [facility] for 11-30-11 to meet DR Mitzi Hansen b/c she thought something was wrong from the surgery. I advised pt that this is standard to go see radiation doctor after having a lumpectomy for radiation tx. That will be going on soon. The pt did not know about her path report so I looked over the results and advised the pt that it showed invasive ductal carcinoma but the pt needed to come back to see DR Dwain Sarna next wk for an appt before going to see DR Mitzi Hansen to get Dr Dwain Sarna to go over all the results with her. I would give her a copy of the path report when she came in for the appt. I will notify Dr Dwain Sarna that I gave the pt her results./ [facility]

## 2011-11-21 NOTE — Progress Notes (Signed)
Mailed after appt letter to pt. 

## 2011-11-23 ENCOUNTER — Telehealth (INDEPENDENT_AMBULATORY_CARE_PROVIDER_SITE_OTHER): Payer: Self-pay | Admitting: General Surgery

## 2011-11-23 ENCOUNTER — Encounter (HOSPITAL_BASED_OUTPATIENT_CLINIC_OR_DEPARTMENT_OTHER): Payer: Self-pay | Admitting: *Deleted

## 2011-11-23 NOTE — Progress Notes (Signed)
Here 11/14/11 For snbx Needs istat

## 2011-11-27 ENCOUNTER — Encounter: Payer: Self-pay | Admitting: Radiation Oncology

## 2011-11-27 ENCOUNTER — Encounter (INDEPENDENT_AMBULATORY_CARE_PROVIDER_SITE_OTHER): Payer: BC Managed Care – PPO | Admitting: General Surgery

## 2011-11-27 NOTE — Progress Notes (Signed)
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 She is married and has two children. Patient lives in [place]. Mammogram noted to have some worrisome calcifications within right breast. Biopsy performed on 10/25/2011 returned positive for high grade DCIS. Tumor is ER+PR+ and HER-2 negative. MRI performed 10/31/2011 show 2cm abnormal area of enhancement. Patient seen in breast clinic by Dr. Mitzi Hansen 11/08/2011. Lumpectomy performed 11/15/11. Noted by Dr. Darnelle Catalan and Dr. Mitzi Hansen that postoperative radiation will decrease risk of local recurrence. Following XRT Dr. Darnelle Catalan plans to recommend 5 year antiestrogen therapy.

## 2011-11-28 ENCOUNTER — Encounter (HOSPITAL_BASED_OUTPATIENT_CLINIC_OR_DEPARTMENT_OTHER): Payer: Self-pay | Admitting: Anesthesiology

## 2011-11-28 ENCOUNTER — Ambulatory Visit (HOSPITAL_BASED_OUTPATIENT_CLINIC_OR_DEPARTMENT_OTHER): Payer: BC Managed Care – PPO | Admitting: Anesthesiology

## 2011-11-28 ENCOUNTER — Ambulatory Visit (HOSPITAL_BASED_OUTPATIENT_CLINIC_OR_DEPARTMENT_OTHER)
Admission: RE | Admit: 2011-11-28 | Discharge: 2011-11-28 | Disposition: A | Discharge status: Home / Self Care | Payer: BC Managed Care – PPO | Source: Ambulatory Visit | Attending: General Surgery | Admitting: General Surgery

## 2011-11-28 ENCOUNTER — Other Ambulatory Visit (INDEPENDENT_AMBULATORY_CARE_PROVIDER_SITE_OTHER): Payer: Self-pay | Admitting: General Surgery

## 2011-11-28 ENCOUNTER — Other Ambulatory Visit (HOSPITAL_COMMUNITY): Payer: BC Managed Care – PPO

## 2011-11-28 ENCOUNTER — Encounter (HOSPITAL_BASED_OUTPATIENT_CLINIC_OR_DEPARTMENT_OTHER)
Admission: RE | Disposition: A | Discharge status: Home / Self Care | Payer: Self-pay | Source: Ambulatory Visit | Attending: General Surgery

## 2011-11-28 ENCOUNTER — Encounter (HOSPITAL_BASED_OUTPATIENT_CLINIC_OR_DEPARTMENT_OTHER): Payer: Self-pay

## 2011-11-28 ENCOUNTER — Ambulatory Visit (HOSPITAL_COMMUNITY)
Admission: RE | Admit: 2011-11-28 | Discharge: 2011-11-28 | Disposition: A | Discharge status: Home / Self Care | Payer: BC Managed Care – PPO | Source: Ambulatory Visit | Attending: General Surgery | Admitting: General Surgery

## 2011-11-28 DIAGNOSIS — Z170 Estrogen receptor positive status [ER+]: Secondary | ICD-10-CM | POA: Insufficient documentation

## 2011-11-28 DIAGNOSIS — C50919 Malignant neoplasm of unspecified site of unspecified female breast: Secondary | ICD-10-CM

## 2011-11-28 DIAGNOSIS — I10 Essential (primary) hypertension: Secondary | ICD-10-CM | POA: Insufficient documentation

## 2011-11-28 LAB — POCT I-STAT, CHEM 8
Hemoglobin: 14.3 g/dL (ref 12.0–15.0)
Sodium: 143 mEq/L (ref 135–145)
TCO2: 32 mmol/L (ref 0–100)

## 2011-11-28 SURGERY — LYMPHADENECTOMY, AXILLARY
Anesthesia: General | Site: Axilla | Laterality: Right | Wound class: Clean

## 2011-11-28 MED ORDER — FENTANYL CITRATE 0.05 MG/ML IJ SOLN
50.0000 ug | INTRAMUSCULAR | Status: DC | PRN
  Administered 2011-11-28: 50 ug via INTRAVENOUS

## 2011-11-28 MED ORDER — ONDANSETRON HCL 4 MG/2ML IJ SOLN
INTRAMUSCULAR | Status: DC | PRN
  Administered 2011-11-28 (×2): 4 mg via INTRAVENOUS

## 2011-11-28 MED ORDER — TECHNETIUM TC 99M SULFUR COLLOID FILTERED
1.0000 | Freq: Once | INTRAVENOUS | Status: AC | PRN
  Administered 2011-11-28: 1 via INTRADERMAL

## 2011-11-28 MED ORDER — FENTANYL CITRATE 0.05 MG/ML IJ SOLN
INTRAMUSCULAR | Status: DC | PRN
  Administered 2011-11-28 (×2): 50 ug via INTRAVENOUS

## 2011-11-28 MED ORDER — BUPIVACAINE HCL (PF) 0.25 % IJ SOLN
INTRAMUSCULAR | Status: DC | PRN
  Administered 2011-11-28: 10 mL

## 2011-11-28 MED ORDER — MIDAZOLAM HCL 5 MG/5ML IJ SOLN
INTRAMUSCULAR | Status: DC | PRN
  Administered 2011-11-28: 2 mg via INTRAVENOUS

## 2011-11-28 MED ORDER — METOCLOPRAMIDE HCL 5 MG/ML IJ SOLN
INTRAMUSCULAR | Status: DC | PRN
  Administered 2011-11-28: 10 mg via INTRAVENOUS

## 2011-11-28 MED ORDER — FENTANYL CITRATE 0.05 MG/ML IJ SOLN: 25.0000 ug | INTRAMUSCULAR | Status: DC | PRN

## 2011-11-28 MED ORDER — PROPOFOL 10 MG/ML IV EMUL
INTRAVENOUS | Status: DC | PRN
  Administered 2011-11-28: 150 mg via INTRAVENOUS

## 2011-11-28 MED ORDER — CEFAZOLIN SODIUM 1-5 GM-% IV SOLN
INTRAVENOUS | Status: DC | PRN
  Administered 2011-11-28: 2 g via INTRAVENOUS

## 2011-11-28 MED ORDER — METOCLOPRAMIDE HCL 5 MG/ML IJ SOLN: 10.0000 mg | Freq: Once | INTRAMUSCULAR | Status: DC | PRN

## 2011-11-28 MED ORDER — OXYCODONE-ACETAMINOPHEN 5-325 MG PO TABS: 1.0000 | ORAL_TABLET | ORAL | Status: AC | PRN

## 2011-11-28 MED ORDER — DEXAMETHASONE SODIUM PHOSPHATE 4 MG/ML IJ SOLN
INTRAMUSCULAR | Status: DC | PRN
  Administered 2011-11-28: 10 mg via INTRAVENOUS

## 2011-11-28 MED ORDER — LACTATED RINGERS IV SOLN
INTRAVENOUS | Status: DC
  Administered 2011-11-28 (×3): via INTRAVENOUS

## 2011-11-28 MED ORDER — ACETAMINOPHEN 10 MG/ML IV SOLN
1000.0000 mg | Freq: Once | INTRAVENOUS | Status: AC
  Administered 2011-11-28: 1000 mg via INTRAVENOUS

## 2011-11-28 MED ORDER — MIDAZOLAM HCL 2 MG/2ML IJ SOLN
0.5000 mg | INTRAMUSCULAR | Status: DC | PRN
  Administered 2011-11-28: 1 mg via INTRAVENOUS

## 2011-11-28 SURGICAL SUPPLY — 52 items
APPLIER CLIP 9.375 MED OPEN (MISCELLANEOUS)
BENZOIN TINCTURE PRP APPL 2/3 (GAUZE/BANDAGES/DRESSINGS) ×2 IMPLANT
BLADE SURG 15 STRL LF DISP TIS (BLADE) ×1 IMPLANT
BLADE SURG 15 STRL SS (BLADE) ×1
CANISTER SUCTION 1200CC (MISCELLANEOUS) ×2 IMPLANT
CHLORAPREP W/TINT 26ML (MISCELLANEOUS) ×2 IMPLANT
CLIP APPLIE 9.375 MED OPEN (MISCELLANEOUS) IMPLANT
CLOSURE STERI STRIP 1/2 X4 (GAUZE/BANDAGES/DRESSINGS) ×2 IMPLANT
CLOTH BEACON ORANGE TIMEOUT ST (SAFETY) ×2 IMPLANT
COVER MAYO STAND STRL (DRAPES) ×2 IMPLANT
COVER TABLE BACK 60X90 (DRAPES) ×2 IMPLANT
DECANTER SPIKE VIAL GLASS SM (MISCELLANEOUS) IMPLANT
DERMABOND ADVANCED (GAUZE/BANDAGES/DRESSINGS) ×1
DERMABOND ADVANCED .7 DNX12 (GAUZE/BANDAGES/DRESSINGS) ×1 IMPLANT
DRAIN CHANNEL 19F RND (DRAIN) IMPLANT
DRAPE LAPAROSCOPIC ABDOMINAL (DRAPES) ×2 IMPLANT
DRSG TEGADERM 4X4.75 (GAUZE/BANDAGES/DRESSINGS) ×2 IMPLANT
ELECT COATED BLADE 2.86 ST (ELECTRODE) ×2 IMPLANT
ELECT REM PT RETURN 9FT ADLT (ELECTROSURGICAL) ×2
ELECTRODE REM PT RTRN 9FT ADLT (ELECTROSURGICAL) ×1 IMPLANT
EVACUATOR SILICONE 100CC (DRAIN) IMPLANT
GAUZE SPONGE 4X4 12PLY STRL LF (GAUZE/BANDAGES/DRESSINGS) ×2 IMPLANT
GLOVE BIO SURGEON STRL SZ7 (GLOVE) ×2 IMPLANT
GLOVE BIOGEL PI IND STRL 7.5 (GLOVE) ×1 IMPLANT
GLOVE BIOGEL PI INDICATOR 7.5 (GLOVE) ×1
GOWN PREVENTION PLUS XLARGE (GOWN DISPOSABLE) ×2 IMPLANT
NEEDLE HYPO 25X1 1.5 SAFETY (NEEDLE) ×4 IMPLANT
NS IRRIG 1000ML POUR BTL (IV SOLUTION) ×2 IMPLANT
PACK BASIN DAY SURGERY FS (CUSTOM PROCEDURE TRAY) ×2 IMPLANT
PENCIL BUTTON HOLSTER BLD 10FT (ELECTRODE) ×2 IMPLANT
PIN SAFETY STERILE (MISCELLANEOUS) IMPLANT
SHEET MEDIUM DRAPE 40X70 STRL (DRAPES) ×2 IMPLANT
SLEEVE SCD COMPRESS KNEE MED (MISCELLANEOUS) ×2 IMPLANT
SPONGE GAUZE 4X4 FOR O.R. (GAUZE/BANDAGES/DRESSINGS) ×2 IMPLANT
SPONGE LAP 4X18 X RAY DECT (DISPOSABLE) ×2 IMPLANT
STAPLER VISISTAT 35W (STAPLE) ×2 IMPLANT
STOCKINETTE IMPERVIOUS LG (DRAPES) ×2 IMPLANT
STRIP CLOSURE SKIN 1/2X4 (GAUZE/BANDAGES/DRESSINGS) ×2 IMPLANT
SUT ETHILON 2 0 FS 18 (SUTURE) IMPLANT
SUT MNCRL AB 4-0 PS2 18 (SUTURE) ×2 IMPLANT
SUT SILK 2 0 SH (SUTURE) IMPLANT
SUT VIC AB 2-0 SH 27 (SUTURE) ×1
SUT VIC AB 2-0 SH 27XBRD (SUTURE) ×1 IMPLANT
SUT VIC AB 3-0 SH 27 (SUTURE) ×1
SUT VIC AB 3-0 SH 27X BRD (SUTURE) ×1 IMPLANT
SUT VICRYL AB 3 0 TIES (SUTURE) IMPLANT
SYR CONTROL 10ML LL (SYRINGE) ×4 IMPLANT
TOWEL OR 17X24 6PK STRL BLUE (TOWEL DISPOSABLE) ×2 IMPLANT
TOWEL OR NON WOVEN STRL DISP B (DISPOSABLE) ×2 IMPLANT
TUBE CONNECTING 20X1/4 (TUBING) ×2 IMPLANT
WATER STERILE IRR 1000ML POUR (IV SOLUTION) IMPLANT
YANKAUER SUCT BULB TIP NO VENT (SUCTIONS) ×2 IMPLANT

## 2011-11-28 NOTE — Op Note (Signed)
Preoperative diagnosis: Stage I right breast cancer status post lumpectomy Postoperative diagnosis: Same as above  Procedure: Right axillary sentinel lymph node biopsy Injection of methylene blue dye for sentinel lymph node identification Anesthesia: Gen. With LMA Surgeon: Dr. Harden Mo Consultations: None Estimated blood loss: None Drains: None Specimens: Right axillary sentinel lymph node x3 with counts of 20-90, 209, 109 Additional non-axillary sentinel lymph node tissue  Sponge and needle count was correct x2 at end of operation Disposition the patient recovery room in stable condition  Indications: This is a 60 year old female who had a right breast mass noted on mammogram. This underwent biopsy showing ductal carcinoma in situ. We discussed a lumpectomy using wire guidance. She underwent this and she ends up having about a 2.6 mm focus of invasive ductal carcinoma on that pathology. This is HER-2/neu positive and estrogen receptor positive. She and I discussed returning to the operating room to do a sentinel lymph node biopsy. Then make further decisions based on the pathology.   Procedure: After informed consent was obtained the patient was administered technetium in the standard periareolar fashion. She was then taken to the operating room.she was given 2 g of intravenous cefazolin. Sequential compression devices were placed on her legs prior to induction with anesthesia. She was in place a general anesthesia with an LMA. She was then prepped and draped in the standard sterile surgical fashion. A surgical timeout was performed.  I made about a 2 cm incision in her right axilla below the  hairline. This was carried out through axillary fascia. I used the neoprobe to identify 3 separate areas of hot and blue nodes. The counts are listed as above. These nodes were all removed separately and passed off the table as specimens. There was also another small area that looked like it had a  blue node but was not radioactive but I removed as well. Upon completion the background radioactivity was less than 20. There was no more blue dye present. Hemostasis was obtained. I then closed the x-ray fashion with 2-0 Vicryl. The subcutaneous tissues closed with 3-0 Vicryl and the skin was closed with 4-0 Monocryl. I infiltrated 10 cc of half percent Marcaine. I then placed airstrips a sterile dressing. She tolerated this well was extubated in the operating room and transferred to recovery room in stable condition.

## 2011-11-28 NOTE — Transfer of Care (Signed)
Immediate Anesthesia Transfer of Care Note  Patient: Penny Foster  Procedure(s) Performed:  AXILLARY LYMPH NODE DISSECTION - Right axillary sentinel node biopsy  Patient Location: PACU  Anesthesia Type: General  Level of Consciousness: sedated  Airway & Oxygen Therapy: Patient Spontanous Breathing and Patient connected to face mask oxygen  Post-op Assessment: Report given to PACU RN and Post -op Vital signs reviewed and stable  Post vital signs: Reviewed and stable  Complications: No apparent anesthesia complications

## 2011-11-28 NOTE — Interval H&P Note (Signed)
History and Physical Interval Note:  11/28/2011 1:17 PM She has undergone right lumpectomy with negative margins.  She has invasive cancer on final pathology.  We discussed sentinel node biopsy given these findings.   Penny Foster  has presented today for surgery, with the diagnosis of right breast cancer  The various methods of treatment have been discussed with the patient and family. After consideration of risks, benefits and other options for treatment, the patient has consented to  Procedure(s): Right axillary sentinel node biopsy as a surgical intervention .  The patients' history has been reviewed, patient examined, no change in status, stable for surgery.  I have reviewed the patients' chart and labs.  Questions were answered to the patient's satisfaction.     Bently Wyss

## 2011-11-28 NOTE — Anesthesia Postprocedure Evaluation (Signed)
  Anesthesia Post Note  Patient: Penny Foster  Procedure(s) Performed:  AXILLARY LYMPH NODE DISSECTION - Right axillary sentinel node biopsy  Anesthesia type: General  Patient location: PACU  Post pain: Pain level controlled  Post assessment: Patient's Cardiovascular Status Stable  Last Vitals:  Filed Vitals:   11/28/11 1500  BP: 139/78  Pulse: 65  Resp: 17    Post vital signs: Reviewed and stable  Level of consciousness: alert  Complications: No apparent anesthesia complications

## 2011-11-28 NOTE — Anesthesia Preprocedure Evaluation (Signed)
Anesthesia Evaluation  Patient identified by MRN, date of birth, ID band Patient awake    Reviewed: Allergy & Precautions, H&P , NPO status , Patient's Chart, lab work & pertinent test results, reviewed documented beta blocker date and time   Airway Mallampati: II TM Distance: >3 FB Neck ROM: full    Dental   Pulmonary neg pulmonary ROS,          Cardiovascular hypertension, On Medications neg cardio ROS     Neuro/Psych Negative Neurological ROS  Negative Psych ROS   GI/Hepatic negative GI ROS, Neg liver ROS,   Endo/Other  Negative Endocrine ROS  Renal/GU negative Renal ROS  Genitourinary negative   Musculoskeletal   Abdominal   Peds  Hematology negative hematology ROS (+)   Anesthesia Other Findings See surgeon's H&P   Reproductive/Obstetrics negative OB ROS                           Anesthesia Physical Anesthesia Plan  ASA: II  Anesthesia Plan: General   Post-op Pain Management:    Induction: Intravenous  Airway Management Planned: LMA  Additional Equipment:   Intra-op Plan:   Post-operative Plan: Extubation in OR  Informed Consent: I have reviewed the patients History and Physical, chart, labs and discussed the procedure including the risks, benefits and alternatives for the proposed anesthesia with the patient or authorized representative who has indicated his/her understanding and acceptance.     Plan Discussed with: CRNA and Surgeon  Anesthesia Plan Comments:         Anesthesia Quick Evaluation

## 2011-11-28 NOTE — Progress Notes (Signed)
Post Nuc med injection VSStable  Pt tolerate procedure well Family in

## 2011-11-28 NOTE — Anesthesia Procedure Notes (Addendum)
Procedure Name: LMA Insertion Date/Time: 11/28/2011 1:35 PM Performed by: Jearld Shines Pre-anesthesia Checklist: Patient identified, Timeout performed, Emergency Drugs available, Suction available and Patient being monitored Patient Re-evaluated:Patient Re-evaluated prior to inductionOxygen Delivery Method: Circle System Utilized Preoxygenation: Pre-oxygenation with 100% oxygen Intubation Type: IV induction Ventilation: Mask ventilation without difficulty LMA: LMA inserted LMA Size: 4.0 Number of attempts: 1 Placement Confirmation: positive ETCO2 and breath sounds checked- equal and bilateral Tube secured with: Tape Dental Injury: Teeth and Oropharynx as per pre-operative assessment

## 2011-11-28 NOTE — H&P (View-Only) (Signed)
Patient ID: Penny Foster, female   DOB: 07/03/1951, 60 y.o.   MRN: [id number]  Chief Complaint  Patient presents with  . Other    breast cancer    HPI Penny Foster is a 60 y.o. female.  Referred by Dr. Gretchen Green HPI This is a 60 yof with no specific breast complaints who underwent a screening mammogram recently. This showed developing heterogeneous calcifications in the upper outer right breast.  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  This is ER positive at 99% and PR positive at 88%.  MRI showed 2 cm region of clumped nodular enhancement in the right breast at nine o'clock extending posteriorly.  She comes in today to discuss options.  Past Medical History  Diagnosis Date  . Meniere disease     Past Surgical History  Procedure Date  . Inner ear surgery     History reviewed. No pertinent family history.  Social History History  Substance Use Topics  . Smoking status: Never Smoker   . Smokeless tobacco: Not on file  . Alcohol Use: No    No Known Allergies  Current Outpatient Prescriptions  Medication Sig Dispense Refill  . diltiazem (DILACOR XR) 180 MG 24 hr capsule       . hydrochlorothiazide (HYDRODIURIL) 25 MG tablet         Review of Systems Review of Systems  Constitutional: Negative for fever, chills and unexpected weight change.  HENT: Negative for hearing loss, congestion, sore throat, trouble swallowing and voice change.   Eyes: Negative for visual disturbance.  Respiratory: Negative for cough and wheezing.   Cardiovascular: Negative for chest pain, palpitations and leg swelling.  Gastrointestinal: Negative for nausea, vomiting, abdominal pain, diarrhea, constipation, blood in stool, abdominal distention and anal bleeding.  Genitourinary: Negative for hematuria, vaginal bleeding and difficulty urinating.  Musculoskeletal: Negative for arthralgias.  Skin: Negative for rash and wound.  Neurological:  Negative for seizures, syncope and headaches.  Hematological: Negative for adenopathy. Does not bruise/bleed easily.  Psychiatric/Behavioral: Negative for confusion.    There were no vitals taken for this visit.  Physical Exam Physical Exam  Constitutional: She appears well-developed and well-nourished.  Neck: Neck supple.  Cardiovascular: Normal rate, regular rhythm and normal heart sounds.   Pulmonary/Chest: Effort normal and breath sounds normal. She has no wheezes. She has no rales. Right breast exhibits no inverted nipple, no mass, no nipple discharge, no skin change and no tenderness. Left breast exhibits no inverted nipple, no mass, no nipple discharge, no skin change and no tenderness. Breasts are symmetrical.  Abdominal: Soft. There is no hepatomegaly.  Lymphadenopathy:    She has no cervical adenopathy.    She has no axillary adenopathy.       Right: No supraclavicular adenopathy present.       Left: No supraclavicular adenopathy present.    Data Reviewed BILATERAL BREAST MRI WITH AND WITHOUT CONTRAST  Technique: Multiplanar, multisequence MR images of both breasts  were obtained prior to and following the intravenous administration  of 15ml of Multihance. Three dimensional images were evaluated at  the independent DynaCad workstation.  Comparison: [facility] mammograms and post biopsy right  diagnostic mammogram at [facility]  Findings: No abnormal T2-weighted hyperintensity or lymphadenopathy  is seen on either side. At the site of biopsy-proven right breast  nine o'clock location DCIS, with internal clip artifact, there are  post biopsy changes with smooth rim enhancement of the biopsy  tract.   There is clumped nodular enhancement extending 2 cm  posterior to the clip artifact, demonstrating plateau type  enhancement kinetics. This abnormal enhancement corresponds to  mammographically evident calcifications extending posterior to the    bullet shaped biopsy clip. No other area of abnormal enhancement  is seen on either side. There is minimal background parenchymal  enhancement type pattern.  IMPRESSION:  2 cm region of abnormal clumped nodular enhancement in the right  breast nine o'clock location at the site of biopsy-proven DCIS,  extending posteriorly from the biopsy clip and corresponding to  mammographically evident calcifications. Attention to this  morphology is recommended at the time of needle localization, with  possible bracketing.  No other area of abnormal enhancement to suggest  multifocal/multicentric or contralateral malignancy.    Assessment    Right breast DCIS    Plan     We discussed the staging and pathophysiology of breast cancer. We discussed all of the different options for treatment for breast cancer including surgery, chemotherapy, radiation therapy, Herceptin, and antiestrogen therapy.   We discussed a sentinel lymph node biopsy as she has some high risk features including comedo necrosis and high grade.  But this does look about 2 cm.  She is right handed and we discussed possibility of node biopsy. After a long discussion we have decided to not pursue sentinel node biopsy at this time.  She understands that she may need to return to operating room if any invasive disease on her specimen. We discussed the options for treatment of the breast cancer which included lumpectomy versus a mastectomy. We discussed the performance of the lumpectomy with a wire placement. We discussed a 10-20% chance of a positive margin requiring reexcision in the operating room. We also discussed that she may need radiation therapy or antiestrogen therapy or both if she undergoes lumpectomy. We discussed the mastectomy and the postoperative care for that as well. We discussed that there is no difference in her survival whether she undergoes lumpectomy with radiation therapy or antiestrogen therapy versus a mastectomy.  There is a slight difference in the local recurrence rate being 3-5% with lumpectomy and about 1% with a mastectomy. We discussed the risks of operation including bleeding, infection, possible reoperation. She understands her further therapy will be based on what her stages at the time of her operation. After discussion, plan for wire guided lumpectomy.          Penny Foster 11/07/2011, 4:43 PM    

## 2011-11-29 ENCOUNTER — Encounter: Payer: Self-pay | Admitting: *Deleted

## 2011-11-30 ENCOUNTER — Encounter (HOSPITAL_BASED_OUTPATIENT_CLINIC_OR_DEPARTMENT_OTHER): Payer: Self-pay | Admitting: General Surgery

## 2011-11-30 ENCOUNTER — Ambulatory Visit: Payer: BC Managed Care – PPO

## 2011-11-30 ENCOUNTER — Ambulatory Visit: Payer: BC Managed Care – PPO | Admitting: Radiation Oncology

## 2011-12-19 ENCOUNTER — Ambulatory Visit (HOSPITAL_BASED_OUTPATIENT_CLINIC_OR_DEPARTMENT_OTHER): Payer: BC Managed Care – PPO | Admitting: Oncology

## 2011-12-19 ENCOUNTER — Encounter: Payer: Self-pay | Admitting: *Deleted

## 2011-12-19 VITALS — BP 140/89 | HR 75 | Temp 97.8°F | Ht 64.0 in | Wt 179.2 lb

## 2011-12-19 DIAGNOSIS — I10 Essential (primary) hypertension: Secondary | ICD-10-CM | POA: Insufficient documentation

## 2011-12-19 DIAGNOSIS — C50419 Malignant neoplasm of upper-outer quadrant of unspecified female breast: Secondary | ICD-10-CM

## 2011-12-19 NOTE — Progress Notes (Signed)
Patient ID: Penny Foster, female   DOB: [birth year], 61 y.o.   MRN: [id number] Penny Foster    CC:  Penny Foster is a 61 year old [place] woman followed for a history of early stage breast cancer.Marland Kitchen  HPI: The patient had a screening mammogram at [facility] in October 2012 showing new calcifications in the right breast. Repeat right diagnostic mammography on 10/04/2011 showed a cluster of heterogeneous calcifications spanning 2.4 cm. 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  With this information the patient was referred for bilateral breast MRI, performed 10/31/2011. This showed a 2 cm area of abnormal clumped nodular enhancement in the right breast 9:00 location extending posteriorly from the biopsy clip. There were no other areas of abnormal enhancement and no evidence of lymph node involvement.  The patient proceeded to right lumpectomyDecember 2012, the pathology showing in addition to her DCIS, 3 areas of invasion, the largest measuring 0.26 cm. The invasive disease was strongly estrogen receptor positive, progesterone receptor negative, but HER-2 amplified. Later the same month the patient proceeded to sentinel lymph node sampling, and this showed 4 sentinel lymph nodes to be clear.   Interval History: the patient now returns with her husband and a close friend at to discuss results of her definitive pathology. She did very well with the surgery, with minimal swelling, no bleeding, no dehiscence, no fever, no unusual pain. She is back to work full-time. She is already scheduled to meet with radiation later this week   Past Medical History  Diagnosis Date  . Meniere disease   . Hypertension   . Complication of anesthesia   . PONV (postoperative nausea and vomiting)     Past Surgical History    Procedure Date  . Inner ear surgery   . Cholecystectomy   . Appendectomy   . Breast lumpectomy 11/15/2011    Procedure: LUMPECTOMY;  Surgeon: [name], MD;  Location: [facility];  Service: General;  Laterality: Right;  right breast wire guided lumpectomy , bracketed  . Axillary lymph node dissection 11/28/2011    Procedure: AXILLARY LYMPH NODE DISSECTION;  Surgeon: [name], MD;  Location: [facility];  Service: General;  Laterality: Right;  Right axillary sentinel node biopsy    Family History: The patient's father died at the age of 49; her mother died at the age of 8. She has 4 brothers and 5 sisters. One sister had stomach cancer, one brother had lung cancer. There is no history of breast or ovarian cancer in the family.  Gynecologic history: Menarche age 81, first pregnancy to term age 33. She is GX P2. She does not recall how long ago she stopped having periods. She never used hormone replacement or birth control pills.  Social History: She works as Research officer, political party. Her husband Idora Brosious is a retired Art gallery manager although he a rose to be a Warehouse manager of the company. Son Jaie [place] lives in [place] where he works in [employer]. Daughter Maria [place] is Interior and spatial designer of the [facility] group home for disabled adults program. The patient has one grandchild. She attends the [employer],  Health maintenance: The patient  reports that she has never smoked. She has never used smokeless tobacco. She reports that she does not drink alcohol or use illicit drugs.    Cholesterol --  Bone density -- performed 2007; pt. Will bring records next visit  Colonoscopy --never  (PAP) OCT 2012   Allergies:  Allergies  Allergen Reactions  . Penicillins Nausea And Vomiting       Current Outpatient Prescriptions  Medication Sig Dispense Refill  . diltiazem (DILACOR XR) 180 MG 24 hr capsule       .  hydrochlorothiazide (HYDRODIURIL) 25 MG tablet       . loratadine (CLARITIN) 10 MG tablet Take 10 mg by mouth daily.        . fluticasone (FLONASE) 50 MCG/ACT nasal spray       . KLOR-CON M20 20 MEQ tablet         ROS a detailed review of systems today was entirely negative. She has not yet returned to regular exercise but is planning to walk 1 hour day 3-5 days a week, which is her usual schedule.Marland Kitchen  Physical Exam: She is a middle-aged [place] American woman who appears comfortable.  Blood pressure 140/89, pulse 75, temperature 97.8 F (36.6 C), height 5\' 4"  (1.626 m), weight 81.285 kg (179 lb 3.2 oz). BMI is 29.9.  Sclerae unicteric Oropharynx clear No peripheral adenopathy and specifically no right axillary adenopathy palpable Lungs no rales or rhonchi Heart regular rate and rhythm Abd benign MSK no focal spinal tenderness, no peripheral edema Neuro: nonfocal Breasts: Right breast status post recent lumpectomy  and sentinel lymph node biopsy.the incisions have healed very nicely. There are no significant skin changes or nipple retraction. Left breast no suspicious masses  LABS   No results found for this or any previous visit (from the past 48 hour(s)).     FILMS:  No results found.   Assessment: 61 year old [name] woman status post right breast biopsy 10/25/2011 for a high-grade ductal carcinoma in situ which was strongly estrogen and progesterone receptor positive, but with definitive right lumpectomy December of 2012 showing microinvasive disease, the largest of 3 foci measuring 2.6 cm. The invasive disease was estrogen receptor and HER-2 positive, progesterone receptor negative. She had subsequent sentinel lymph node sampling also December of 2012, with all 4 sentinel lymph nodes negative.    Plan: her overall prognosis is excellent and the NCCN guidelines suggest that endocrine treatment to be considered in women like her. Certainly she is not a  chemotherapy candidate and given her very low risk of recurrence I think trastuzumab also would be overkill. I did recommend anti-estrogen therapy partly because the tumor is HER-2 positive (even though there is some interference due to cross talk with the HER-2 receptor) and partly because antiestrogen therapy particularly with tamoxifen will also reduce her risk of developing a new breast cancer in either breast.  We discussed the side effects toxicities and complications of tamoxifen and this information was given to her in writing. She will not start until she completes radiation, the target date being March 1 for her to start tamoxifen. She will return to see me in May. He she is tolerating this well we will start seeing her on a every 6 month basis for the next 2 years.  Lacresha Fusilier C 12/19/2011, 10:28 AM

## 2011-12-20 ENCOUNTER — Encounter: Payer: Self-pay | Admitting: Oncology

## 2011-12-20 ENCOUNTER — Other Ambulatory Visit: Payer: Self-pay | Admitting: *Deleted

## 2011-12-20 MED ORDER — TAMOXIFEN CITRATE 20 MG PO TABS: 20.0000 mg | ORAL_TABLET | Freq: Every day | ORAL | Status: DC

## 2011-12-20 NOTE — Progress Notes (Signed)
Put patient's cancer policy form on nurse's desk.

## 2011-12-21 ENCOUNTER — Ambulatory Visit
Admission: RE | Admit: 2011-12-21 | Discharge: 2011-12-21 | Disposition: A | Discharge status: Home / Self Care | Payer: BC Managed Care – PPO | Source: Ambulatory Visit | Attending: Radiation Oncology | Admitting: Radiation Oncology

## 2011-12-21 ENCOUNTER — Encounter: Payer: Self-pay | Admitting: Radiation Oncology

## 2011-12-21 VITALS — BP 121/82 | HR 73 | Temp 97.6°F | Resp 20 | Ht 62.0 in | Wt 179.0 lb

## 2011-12-21 DIAGNOSIS — C50919 Malignant neoplasm of unspecified site of unspecified female breast: Secondary | ICD-10-CM

## 2011-12-21 DIAGNOSIS — C50419 Malignant neoplasm of upper-outer quadrant of unspecified female breast: Secondary | ICD-10-CM

## 2011-12-21 NOTE — Progress Notes (Signed)
Please see the Nurse Progress Note in the MD Initial Consult Encounter for this patient. 

## 2011-12-21 NOTE — Progress Notes (Signed)
Encounter addended by: Glennie Hawk, RN on: 12/21/2011  1:55 PM<BR>     Documentation filed: Charges VN

## 2011-12-21 NOTE — Progress Notes (Signed)
Pt w/family. No c/o.

## 2011-12-22 NOTE — Progress Notes (Signed)
CC:   Lowella Dell, M.D. Juanetta Gosling, MD Harrel Lemon, MD  DIAGNOSIS:  Invasive ductal carcinoma of the right breast, pT1a pN0, estrogen receptor positive, progesterone receptor negative, HER-2/neu positive.  INTERVAL HISTORY:  Penny Foster returns to clinic today for followup.  Since she was seen in Breast Clinic, she has undergone a lumpectomy and subsequent sentinel lymph node evaluation due to the fact that some invasion was seen on her final pathology.  Her pathology did return positive for an invasive ductal carcinoma, grade 2, with scattered foci of invasion with the largest spanning 0.26 cm.  There was associated high-grade ductal carcinoma in situ with this.  The margins were negative and no lymphovascular space invasion was seen.  This corresponded to a T1a tumor pathologically and receptor studies have indicated that the tumor is ER positive, PR negative, and HER-2/neu positive with a proliferative index of 16%.  The patient completed this on 11/15/2011 and then subsequently underwent a sentinel lymph node biopsy on 11/28/2011.  None of the 4 lymph nodes examined demonstrated carcinoma.  Therefore, the patient is status post a lumpectomy for a T1a N0 invasive ductal carcinoma of the right breast and she indicates that she has done very well postoperatively.  She has been seen by Dr. Darnelle Catalan, who is planning hormonal treatment after an anticipated course of radiation.  The patient therefore presents today for further discussion of this and coordination.  PHYSICAL EXAMINATION:  Vital Signs:  Weight 179 pounds, blood pressure 121/82, pulse 73, respiratory rate 20, temperature 97.6.  General:  Well- developed female in no acute distress, alert and oriented x3.  Breasts: Examination of the right breast indicates 2 well-healed surgical incisions in the lateral right breast and right axilla.  No suspicious findings and the patient has healed very  nicely.  IMPRESSION AND PLAN:  Penny Foster returns to clinic today for followup status post lumpectomy for her right-sided breast cancer and she is appropriate to proceed with adjuvant radiation treatment at this point. The patient it is anticipated will proceed with adjuvant hormonal treatment after this course.  I discussed with her once again what would be entailed in a typical 6-1/2-week course of treatment and all of her questions were answered.  She has been scheduled for a simulation next week.  I spent 15 minutes with [name] today, the majority of which was spent counseling her on her diagnosis and coordinating her care.    ______________________________ Radene Gunning, M.D., Ph.D. JSM/MEDQ  D:  12/21/2011  T:  12/22/2011  Job:  [id number]

## 2011-12-26 ENCOUNTER — Encounter: Payer: Self-pay | Admitting: Oncology

## 2011-12-26 NOTE — Progress Notes (Signed)
Mailed cancer form to [facility] [employer]  Attn: Claims Dept [id number] [place], [place] [address].

## 2011-12-27 ENCOUNTER — Ambulatory Visit
Admission: RE | Admit: 2011-12-27 | Discharge: 2011-12-27 | Disposition: A | Discharge status: Home / Self Care | Payer: BC Managed Care – PPO | Source: Ambulatory Visit | Attending: Radiation Oncology | Admitting: Radiation Oncology

## 2011-12-27 DIAGNOSIS — C50419 Malignant neoplasm of upper-outer quadrant of unspecified female breast: Secondary | ICD-10-CM

## 2011-12-27 NOTE — Progress Notes (Signed)
Met with patient to discuss RO billing.  Patient had no concerns today. 

## 2012-01-03 ENCOUNTER — Ambulatory Visit
Admission: RE | Admit: 2012-01-03 | Discharge: 2012-01-03 | Disposition: A | Discharge status: Home / Self Care | Payer: BC Managed Care – PPO | Source: Ambulatory Visit | Attending: Radiation Oncology | Admitting: Radiation Oncology

## 2012-01-03 DIAGNOSIS — C50519 Malignant neoplasm of lower-outer quadrant of unspecified female breast: Secondary | ICD-10-CM

## 2012-01-07 ENCOUNTER — Ambulatory Visit
Admission: RE | Admit: 2012-01-07 | Discharge: 2012-01-07 | Disposition: A | Discharge status: Home / Self Care | Payer: BC Managed Care – PPO | Source: Ambulatory Visit | Attending: Radiation Oncology | Admitting: Radiation Oncology

## 2012-01-07 ENCOUNTER — Ambulatory Visit: Payer: BC Managed Care – PPO

## 2012-01-07 DIAGNOSIS — C50519 Malignant neoplasm of lower-outer quadrant of unspecified female breast: Secondary | ICD-10-CM

## 2012-01-07 MED ORDER — ALRA NON-METALLIC DEODORANT (RAD-ONC)
1.0000 "application " | Freq: Once | TOPICAL | Status: AC
  Administered 2012-01-07: 1 via TOPICAL

## 2012-01-07 MED ORDER — RADIAPLEXRX EX GEL
Freq: Once | CUTANEOUS | Status: AC
  Administered 2012-01-07: 09:00:00 via TOPICAL

## 2012-01-07 NOTE — Progress Notes (Signed)
Post sim ed done; gave pt "Radiation and You" booklet. All questions answered. Pt states she has slight nausea today, possibly "from her nerves" states she will request script from Dr Mitzi Hansen on PUT day, Thursday.

## 2012-01-07 NOTE — Procedures (Signed)
DIAGNOSIS:  Breast cancer.  NARRATIVE:  Ms. Pendergraft presented for port films prior to beginning her course of radiation for her diagnosis of breast cancer.  The isocenter is accurately placed and the blocks appropriately delineate the target treatment region.  Therefore, we will proceed with her treatment as planned.    ______________________________ Radene Gunning, M.D., Ph.D. JSM/MEDQ  D:  01/06/2012  T:  01/07/2012  Job:  [id number]

## 2012-01-08 ENCOUNTER — Ambulatory Visit
Admission: RE | Admit: 2012-01-08 | Discharge: 2012-01-08 | Disposition: A | Discharge status: Home / Self Care | Payer: BC Managed Care – PPO | Source: Ambulatory Visit | Attending: Radiation Oncology | Admitting: Radiation Oncology

## 2012-01-09 ENCOUNTER — Ambulatory Visit
Admission: RE | Admit: 2012-01-09 | Discharge: 2012-01-09 | Disposition: A | Discharge status: Home / Self Care | Payer: BC Managed Care – PPO | Source: Ambulatory Visit | Attending: Radiation Oncology | Admitting: Radiation Oncology

## 2012-01-10 ENCOUNTER — Encounter (INDEPENDENT_AMBULATORY_CARE_PROVIDER_SITE_OTHER): Payer: BC Managed Care – PPO | Admitting: General Surgery

## 2012-01-10 ENCOUNTER — Ambulatory Visit
Admission: RE | Admit: 2012-01-10 | Discharge: 2012-01-10 | Disposition: A | Discharge status: Home / Self Care | Payer: BC Managed Care – PPO | Source: Ambulatory Visit | Attending: Radiation Oncology | Admitting: Radiation Oncology

## 2012-01-11 ENCOUNTER — Ambulatory Visit
Admission: RE | Admit: 2012-01-11 | Discharge: 2012-01-11 | Disposition: A | Discharge status: Home / Self Care | Payer: BC Managed Care – PPO | Source: Ambulatory Visit | Attending: Radiation Oncology | Admitting: Radiation Oncology

## 2012-01-11 DIAGNOSIS — C50419 Malignant neoplasm of upper-outer quadrant of unspecified female breast: Secondary | ICD-10-CM

## 2012-01-13 NOTE — Progress Notes (Signed)
[facility] Radiation Oncology Weekly Treatment Note    Name: Penny Foster Date: 01/13/2012 MRN: [id number] DOB: May 01, 1951  Status: outpatient    Current dose: 720  Current fraction:4  Planned dose:6040  Planned fraction:33   MEDICATIONS: Current Outpatient Prescriptions  Medication Sig Dispense Refill  . diltiazem (DILACOR XR) 180 MG 24 hr capsule       . fluticasone (FLONASE) 50 MCG/ACT nasal spray       . hydrochlorothiazide (HYDRODIURIL) 25 MG tablet       . KLOR-CON M20 20 MEQ tablet       . loratadine (CLARITIN) 10 MG tablet Take 10 mg by mouth daily.        . tamoxifen (NOLVADEX) 20 MG tablet Take 1 tablet (20 mg total) by mouth daily.  90 tablet  1 yr     ALLERGIES: Penicillins   LABORATORY DATA:  Lab Results  Component Value Date   WBC 9.8 11/07/2011   HGB 14.3 11/28/2011   HCT 42.0 11/28/2011   MCV 86.4 11/07/2011   PLT 325 11/07/2011   Lab Results  Component Value Date   NA 143 11/28/2011   K 3.0* 11/28/2011   CL 103 11/28/2011   CO2 27 11/14/2011   Lab Results  Component Value Date   ALT 16 11/07/2011   AST 17 11/07/2011   ALKPHOS 81 11/07/2011   BILITOT 0.3 11/07/2011      NARRATIVE: Penny Foster was seen today for weekly treatment management. The chart was checked and port films images were reviewed. Pt doing well. No complaints. No skin change so far  PHYSICAL EXAMINATION: alert, no distress  ASSESSMENT: Patient tolerating treatments well.    PLAN: Continue treatment as planned.

## 2012-01-14 ENCOUNTER — Ambulatory Visit
Admission: RE | Admit: 2012-01-14 | Discharge: 2012-01-14 | Disposition: A | Discharge status: Home / Self Care | Payer: BC Managed Care – PPO | Source: Ambulatory Visit | Attending: Radiation Oncology | Admitting: Radiation Oncology

## 2012-01-15 ENCOUNTER — Ambulatory Visit
Admission: RE | Admit: 2012-01-15 | Discharge: 2012-01-15 | Disposition: A | Discharge status: Home / Self Care | Payer: BC Managed Care – PPO | Source: Ambulatory Visit | Attending: Radiation Oncology | Admitting: Radiation Oncology

## 2012-01-16 ENCOUNTER — Ambulatory Visit
Admission: RE | Admit: 2012-01-16 | Discharge: 2012-01-16 | Disposition: A | Discharge status: Home / Self Care | Payer: BC Managed Care – PPO | Source: Ambulatory Visit | Attending: Radiation Oncology | Admitting: Radiation Oncology

## 2012-01-17 ENCOUNTER — Ambulatory Visit
Admission: RE | Admit: 2012-01-17 | Discharge: 2012-01-17 | Disposition: A | Discharge status: Home / Self Care | Payer: BC Managed Care – PPO | Source: Ambulatory Visit | Attending: Radiation Oncology | Admitting: Radiation Oncology

## 2012-01-17 ENCOUNTER — Encounter: Payer: Self-pay | Admitting: Radiation Oncology

## 2012-01-17 VITALS — Wt 178.7 lb

## 2012-01-17 DIAGNOSIS — C50419 Malignant neoplasm of upper-outer quadrant of unspecified female breast: Secondary | ICD-10-CM

## 2012-01-17 NOTE — Progress Notes (Signed)
[facility] Radiation Oncology Weekly Treatment Note    Name: Penny Foster Date: 01/17/2012 MRN: [id number] DOB: [birth year]  Status: outpatient    Current dose: 1620  Current fraction:9  Planned dose:6100  Planned fraction:33   MEDICATIONS: Current Outpatient Prescriptions  Medication Sig Dispense Refill  . diltiazem (DILACOR XR) 180 MG 24 hr capsule       . fluticasone (FLONASE) 50 MCG/ACT nasal spray       . hydrochlorothiazide (HYDRODIURIL) 25 MG tablet       . KLOR-CON M20 20 MEQ tablet       . loratadine (CLARITIN) 10 MG tablet Take 10 mg by mouth daily.        . tamoxifen (NOLVADEX) 20 MG tablet Take 1 tablet (20 mg total) by mouth daily.  90 tablet  1 yr     ALLERGIES: Penicillins   LABORATORY DATA:  Lab Results  Component Value Date   WBC 9.8 11/07/2011   HGB 14.3 11/28/2011   HCT 42.0 11/28/2011   MCV 86.4 11/07/2011   PLT 325 11/07/2011   Lab Results  Component Value Date   NA 143 11/28/2011   K 3.0* 11/28/2011   CL 103 11/28/2011   CO2 27 11/14/2011   Lab Results  Component Value Date   ALT 16 11/07/2011   AST 17 11/07/2011   ALKPHOS 81 11/07/2011   BILITOT 0.3 11/07/2011      NARRATIVE: Penny Foster was seen today for weekly treatment management. The chart was checked and port films images were reviewed. Pt doing well. No complaints today.  PHYSICAL EXAMINATION: weight is 178 lb 11.2 oz (81.058 kg).        ASSESSMENT: Patient tolerating treatments well.    PLAN: Continue treatment as planned. Continuing skin care.

## 2012-01-17 NOTE — Progress Notes (Signed)
Pt has no c/o today 

## 2012-01-18 ENCOUNTER — Ambulatory Visit
Admission: RE | Admit: 2012-01-18 | Discharge: 2012-01-18 | Disposition: A | Discharge status: Home / Self Care | Payer: BC Managed Care – PPO | Source: Ambulatory Visit | Attending: Radiation Oncology | Admitting: Radiation Oncology

## 2012-01-21 ENCOUNTER — Ambulatory Visit
Admission: RE | Admit: 2012-01-21 | Discharge: 2012-01-21 | Disposition: A | Discharge status: Home / Self Care | Payer: BC Managed Care – PPO | Source: Ambulatory Visit | Attending: Radiation Oncology | Admitting: Radiation Oncology

## 2012-01-22 ENCOUNTER — Ambulatory Visit
Admission: RE | Admit: 2012-01-22 | Discharge: 2012-01-22 | Disposition: A | Discharge status: Home / Self Care | Payer: BC Managed Care – PPO | Source: Ambulatory Visit | Attending: Radiation Oncology | Admitting: Radiation Oncology

## 2012-01-23 ENCOUNTER — Ambulatory Visit
Admission: RE | Admit: 2012-01-23 | Discharge: 2012-01-23 | Disposition: A | Discharge status: Home / Self Care | Payer: BC Managed Care – PPO | Source: Ambulatory Visit | Attending: Radiation Oncology | Admitting: Radiation Oncology

## 2012-01-24 ENCOUNTER — Ambulatory Visit
Admission: RE | Admit: 2012-01-24 | Discharge: 2012-01-24 | Disposition: A | Discharge status: Home / Self Care | Payer: BC Managed Care – PPO | Source: Ambulatory Visit | Attending: Radiation Oncology | Admitting: Radiation Oncology

## 2012-01-25 ENCOUNTER — Ambulatory Visit
Admission: RE | Admit: 2012-01-25 | Discharge: 2012-01-25 | Disposition: A | Discharge status: Home / Self Care | Payer: BC Managed Care – PPO | Source: Ambulatory Visit | Attending: Radiation Oncology | Admitting: Radiation Oncology

## 2012-01-25 DIAGNOSIS — C50419 Malignant neoplasm of upper-outer quadrant of unspecified female breast: Secondary | ICD-10-CM

## 2012-01-25 NOTE — Progress Notes (Signed)
Here for routine weekly radiation under treat visit for assessment by md. Has completed 15 of 25 whole breast .Has mild hyperpigmentation. No breaks or peeling of skin. Denies pain or fatigue. To continue application of radiaplex.

## 2012-01-28 ENCOUNTER — Ambulatory Visit
Admission: RE | Admit: 2012-01-28 | Discharge: 2012-01-28 | Disposition: A | Discharge status: Home / Self Care | Payer: BC Managed Care – PPO | Source: Ambulatory Visit | Attending: Radiation Oncology | Admitting: Radiation Oncology

## 2012-01-28 ENCOUNTER — Ambulatory Visit: Payer: BC Managed Care – PPO | Admitting: Oncology

## 2012-01-29 ENCOUNTER — Ambulatory Visit
Admission: RE | Admit: 2012-01-29 | Discharge: 2012-01-29 | Disposition: A | Discharge status: Home / Self Care | Payer: BC Managed Care – PPO | Source: Ambulatory Visit | Attending: Radiation Oncology | Admitting: Radiation Oncology

## 2012-01-30 ENCOUNTER — Ambulatory Visit
Admission: RE | Admit: 2012-01-30 | Discharge: 2012-01-30 | Disposition: A | Discharge status: Home / Self Care | Payer: BC Managed Care – PPO | Source: Ambulatory Visit | Attending: Radiation Oncology | Admitting: Radiation Oncology

## 2012-01-31 ENCOUNTER — Ambulatory Visit
Admission: RE | Admit: 2012-01-31 | Discharge: 2012-01-31 | Disposition: A | Discharge status: Home / Self Care | Payer: BC Managed Care – PPO | Source: Ambulatory Visit | Attending: Radiation Oncology | Admitting: Radiation Oncology

## 2012-01-31 ENCOUNTER — Encounter: Payer: Self-pay | Admitting: Radiation Oncology

## 2012-01-31 VITALS — BP 147/90 | HR 77 | Resp 18 | Wt 176.6 lb

## 2012-01-31 DIAGNOSIS — C50419 Malignant neoplasm of upper-outer quadrant of unspecified female breast: Secondary | ICD-10-CM

## 2012-01-31 NOTE — Progress Notes (Signed)
[facility] Radiation Oncology Weekly Treatment Note    Name: Penny Foster Date: 01/31/2012 MRN: [id number] DOB: [birth year]  Status: outpatient    Current dose: 2700  Current fraction:15  Planned dose: 6100  Planned fraction:33   MEDICATIONS: Current Outpatient Prescriptions  Medication Sig Dispense Refill  . diltiazem (DILACOR XR) 180 MG 24 hr capsule       . fluticasone (FLONASE) 50 MCG/ACT nasal spray       . hydrochlorothiazide (HYDRODIURIL) 25 MG tablet       . KLOR-CON M20 20 MEQ tablet       . loratadine (CLARITIN) 10 MG tablet Take 10 mg by mouth daily.        . tamoxifen (NOLVADEX) 20 MG tablet Take 1 tablet (20 mg total) by mouth daily.  90 tablet  1 yr     ALLERGIES: Penicillins   LABORATORY DATA:  Lab Results  Component Value Date   WBC 9.8 11/07/2011   HGB 14.3 11/28/2011   HCT 42.0 11/28/2011   MCV 86.4 11/07/2011   PLT 325 11/07/2011   Lab Results  Component Value Date   NA 143 11/28/2011   K 3.0* 11/28/2011   CL 103 11/28/2011   CO2 27 11/14/2011   Lab Results  Component Value Date   ALT 16 11/07/2011   AST 17 11/07/2011   ALKPHOS 81 11/07/2011   BILITOT 0.3 11/07/2011      NARRATIVE: [name] was seen today for weekly treatment management. The chart was checked and port films images were reviewed. Pt doing well. No complaints.  PHYSICAL EXAMINATION: vitals were not taken for this visit.    Moderate skin change as expected/ erythema   ASSESSMENT: Patient tolerating treatments well.    PLAN: Continue treatment as planned.

## 2012-01-31 NOTE — Progress Notes (Signed)
Patient presents to the clinic today unaccompanied for under treat visit with Dr. Mitzi Hansen. Patient is alert and oriented to person, place, and time. No distress noted. Steady gait noted. Pleasant affect noted. Patient denies pain at this time. Hyperpigmentation without any break of the skin noted on the right/treated breast. Patient reports she is using "the cream we gave her like we told her to." patient has no complaints. Reported all findings to Dr. Mitzi Hansen.

## 2012-02-01 ENCOUNTER — Ambulatory Visit
Admission: RE | Admit: 2012-02-01 | Discharge: 2012-02-01 | Disposition: A | Discharge status: Home / Self Care | Payer: BC Managed Care – PPO | Source: Ambulatory Visit | Attending: Radiation Oncology | Admitting: Radiation Oncology

## 2012-02-02 NOTE — Progress Notes (Signed)
[facility] Radiation Oncology Weekly Treatment Note    Name: Penny Foster Date: 02/02/2012 MRN: [id number] DOB: 19-Apr-1951  Status: outpatient    Current dose: 3420  Current fraction:19  Planned dose:6100  Planned fraction:33   MEDICATIONS: Current Outpatient Prescriptions  Medication Sig Dispense Refill  . diltiazem (DILACOR XR) 180 MG 24 hr capsule       . fluticasone (FLONASE) 50 MCG/ACT nasal spray       . hydrochlorothiazide (HYDRODIURIL) 25 MG tablet       . KLOR-CON M20 20 MEQ tablet       . loratadine (CLARITIN) 10 MG tablet Take 10 mg by mouth daily.        . tamoxifen (NOLVADEX) 20 MG tablet Take 1 tablet (20 mg total) by mouth daily.  90 tablet  1 yr     ALLERGIES: Penicillins   LABORATORY DATA:  Lab Results  Component Value Date   WBC 9.8 11/07/2011   HGB 14.3 11/28/2011   HCT 42.0 11/28/2011   MCV 86.4 11/07/2011   PLT 325 11/07/2011   Lab Results  Component Value Date   NA 143 11/28/2011   K 3.0* 11/28/2011   CL 103 11/28/2011   CO2 27 11/14/2011   Lab Results  Component Value Date   ALT 16 11/07/2011   AST 17 11/07/2011   ALKPHOS 81 11/07/2011   BILITOT 0.3 11/07/2011      NARRATIVE: Penny Foster was seen today for weekly treatment management. The chart was checked and port films images were reviewed. Pt doing well. Using skin cream.  PHYSICAL EXAMINATION: weight is 176 lb 9.6 oz (80.105 kg). Her blood pressure is 147/90 and her pulse is 77. Her respiration is 18.    Hyperpigmentation, no desquamation.   ASSESSMENT: Patient tolerating treatments well.    PLAN: Continue treatment as planned.

## 2012-02-04 ENCOUNTER — Ambulatory Visit (INDEPENDENT_AMBULATORY_CARE_PROVIDER_SITE_OTHER): Payer: BC Managed Care – PPO | Admitting: General Surgery

## 2012-02-04 ENCOUNTER — Encounter (INDEPENDENT_AMBULATORY_CARE_PROVIDER_SITE_OTHER): Payer: Self-pay | Admitting: General Surgery

## 2012-02-04 ENCOUNTER — Ambulatory Visit
Admission: RE | Admit: 2012-02-04 | Discharge: 2012-02-04 | Disposition: A | Discharge status: Home / Self Care | Payer: BC Managed Care – PPO | Source: Ambulatory Visit | Attending: Radiation Oncology | Admitting: Radiation Oncology

## 2012-02-04 VITALS — BP 134/88 | HR 76 | Temp 98.2°F | Resp 20 | Ht 62.0 in | Wt 178.2 lb

## 2012-02-04 DIAGNOSIS — C50519 Malignant neoplasm of lower-outer quadrant of unspecified female breast: Secondary | ICD-10-CM

## 2012-02-04 NOTE — Progress Notes (Signed)
Subjective:     Patient ID: Penny Foster, female   DOB: 05-24-1951, 61 y.o.   MRN: [id number]  HPI This is a 61 year old female who in December underwent a right breast wire-guided lumpectomy as well as a right axillary sentinel node biopsy.  She is currently almost done with the radiation therapy. She is due to begin tamoxifen once this is completed. She came in to see me today for a scheduled followup. She reports no complaints since I last saw her and is doing very well.  Review of Systems     Objective:   Physical Exam  Vitals reviewed. Constitutional: She appears well-developed and well-nourished.  Pulmonary/Chest: Right breast exhibits skin change. Right breast exhibits no inverted nipple, no mass, no nipple discharge and no tenderness.    Lymphadenopathy:       Right axillary: No pectoral and no lateral adenopathy present.       Assessment:    Right breast cancer s/p surgery, now undergoing xrt    Plan:        She is doing very well and has a good cosmetic result. I told her I would could see her in one year as I assume Dr. Darnelle Catalan will be seeing her in 6 months. She is due to start tamoxifen coming up soon. She knows that she is due to get her mammogram on time. She's going to continue her own self exams and will call me as needed before she sees me in one year.

## 2012-02-05 ENCOUNTER — Ambulatory Visit
Admission: RE | Admit: 2012-02-05 | Discharge: 2012-02-05 | Disposition: A | Discharge status: Home / Self Care | Payer: BC Managed Care – PPO | Source: Ambulatory Visit | Attending: Radiation Oncology | Admitting: Radiation Oncology

## 2012-02-05 DIAGNOSIS — C50419 Malignant neoplasm of upper-outer quadrant of unspecified female breast: Secondary | ICD-10-CM | POA: Insufficient documentation

## 2012-02-06 ENCOUNTER — Ambulatory Visit
Admission: RE | Admit: 2012-02-06 | Discharge: 2012-02-06 | Disposition: A | Discharge status: Home / Self Care | Payer: BC Managed Care – PPO | Source: Ambulatory Visit | Attending: Radiation Oncology | Admitting: Radiation Oncology

## 2012-02-06 DIAGNOSIS — C50419 Malignant neoplasm of upper-outer quadrant of unspecified female breast: Secondary | ICD-10-CM

## 2012-02-07 ENCOUNTER — Ambulatory Visit
Admission: RE | Admit: 2012-02-07 | Discharge: 2012-02-07 | Disposition: A | Discharge status: Home / Self Care | Payer: BC Managed Care – PPO | Source: Ambulatory Visit | Attending: Radiation Oncology | Admitting: Radiation Oncology

## 2012-02-07 ENCOUNTER — Encounter: Payer: Self-pay | Admitting: Radiation Oncology

## 2012-02-07 VITALS — Wt 178.3 lb

## 2012-02-07 DIAGNOSIS — C50419 Malignant neoplasm of upper-outer quadrant of unspecified female breast: Secondary | ICD-10-CM

## 2012-02-07 NOTE — Progress Notes (Signed)
Pt ambulating steady gait, seeing Md before treatment today , 23/33 right breast , tanning, dryness under axilla, skin intact,no breakdown, pt tolerating well, no c/o pain  3:47 PM

## 2012-02-08 ENCOUNTER — Ambulatory Visit
Admission: RE | Admit: 2012-02-08 | Discharge: 2012-02-08 | Disposition: A | Discharge status: Home / Self Care | Payer: BC Managed Care – PPO | Source: Ambulatory Visit | Attending: Radiation Oncology | Admitting: Radiation Oncology

## 2012-02-08 DIAGNOSIS — C50419 Malignant neoplasm of upper-outer quadrant of unspecified female breast: Secondary | ICD-10-CM

## 2012-02-08 NOTE — Progress Notes (Signed)
[facility] Radiation Oncology Weekly Treatment Note    Name: Ladye Macnaughton Date: 02/08/2012 MRN: [id number] DOB: June 15, 1951  Status: outpatient    Current dose: 4140 cGy  Current fraction: 23  Planned dose: 6100 cGy  Planned fraction: 33   MEDICATIONS: Current Outpatient Prescriptions  Medication Sig Dispense Refill  . diltiazem (DILACOR XR) 180 MG 24 hr capsule       . fluticasone (FLONASE) 50 MCG/ACT nasal spray       . hydrochlorothiazide (HYDRODIURIL) 25 MG tablet       . ibuprofen (ADVIL,MOTRIN) 200 MG tablet Take 200 mg by mouth every 6 (six) hours as needed. 2 po prn low back pain      . KLOR-CON M20 20 MEQ tablet       . loratadine (CLARITIN) 10 MG tablet Take 10 mg by mouth daily.        . tamoxifen (NOLVADEX) 20 MG tablet Take 1 tablet (20 mg total) by mouth daily.  90 tablet  1 yr     ALLERGIES: Penicillins   LABORATORY DATA:  Lab Results  Component Value Date   WBC 9.8 11/07/2011   HGB 14.3 11/28/2011   HCT 42.0 11/28/2011   MCV 86.4 11/07/2011   PLT 325 11/07/2011   Lab Results  Component Value Date   NA 143 11/28/2011   K 3.0* 11/28/2011   CL 103 11/28/2011   CO2 27 11/14/2011   Lab Results  Component Value Date   ALT 16 11/07/2011   AST 17 11/07/2011   ALKPHOS 81 11/07/2011   BILITOT 0.3 11/07/2011      NARRATIVE: Earlisha Sharples was seen today for weekly treatment management. The chart was checked and port films images were reviewed. The patient continues to do well with treatment. No major changes since last week. No significant discomfort.  PHYSICAL EXAMINATION: weight is 178 lb 4.8 oz (80.876 kg).    the patient's skin shows some diffuse radiation effect with some increased pigmentation. No significant desquamation.   ASSESSMENT: Patient tolerating treatments well.    PLAN: Continue treatment as planned.

## 2012-02-11 ENCOUNTER — Ambulatory Visit
Admission: RE | Admit: 2012-02-11 | Discharge: 2012-02-11 | Disposition: A | Discharge status: Home / Self Care | Payer: BC Managed Care – PPO | Source: Ambulatory Visit | Attending: Radiation Oncology | Admitting: Radiation Oncology

## 2012-02-11 NOTE — Progress Notes (Signed)
Diagnosis breast cancer  Simulation verification note  The patient presented for port films. The isocenter is accurately placed. The blocks target the correct area. We therefore will proceed with her course of radiation as planned.

## 2012-02-12 ENCOUNTER — Ambulatory Visit
Admission: RE | Admit: 2012-02-12 | Discharge: 2012-02-12 | Disposition: A | Discharge status: Home / Self Care | Payer: BC Managed Care – PPO | Source: Ambulatory Visit | Attending: Radiation Oncology | Admitting: Radiation Oncology

## 2012-02-13 ENCOUNTER — Ambulatory Visit
Admission: RE | Admit: 2012-02-13 | Discharge: 2012-02-13 | Disposition: A | Discharge status: Home / Self Care | Payer: BC Managed Care – PPO | Source: Ambulatory Visit | Attending: Radiation Oncology | Admitting: Radiation Oncology

## 2012-02-14 ENCOUNTER — Ambulatory Visit
Admission: RE | Admit: 2012-02-14 | Discharge: 2012-02-14 | Disposition: A | Discharge status: Home / Self Care | Payer: BC Managed Care – PPO | Source: Ambulatory Visit | Attending: Radiation Oncology | Admitting: Radiation Oncology

## 2012-02-15 ENCOUNTER — Ambulatory Visit
Admission: RE | Admit: 2012-02-15 | Discharge: 2012-02-15 | Disposition: A | Discharge status: Home / Self Care | Payer: BC Managed Care – PPO | Source: Ambulatory Visit | Attending: Radiation Oncology | Admitting: Radiation Oncology

## 2012-02-15 VITALS — Wt 173.3 lb

## 2012-02-15 DIAGNOSIS — C50419 Malignant neoplasm of upper-outer quadrant of unspecified female breast: Secondary | ICD-10-CM

## 2012-02-15 MED ORDER — RADIAPLEXRX EX GEL
Freq: Once | CUTANEOUS | Status: AC
  Administered 2012-02-15: 1 via TOPICAL

## 2012-02-15 NOTE — Progress Notes (Signed)
SKIN APPEARS DARK WITH DRY DESQUAMATION NOTED UNDER ARM AND UNDER BREAST.  NO C/O

## 2012-02-15 NOTE — Progress Notes (Signed)
[facility] Radiation Oncology Weekly Treatment Note    Name: Marye Eagen Date: 02/15/2012 MRN: [id number] DOB: 1951/10/10  Status: outpatient    Current dose: 55 gray  Current fraction: 30  Planned dose: 61 gray  Planned fraction: 33   MEDICATIONS: Current Outpatient Prescriptions  Medication Sig Dispense Refill  . diltiazem (DILACOR XR) 180 MG 24 hr capsule       . fluticasone (FLONASE) 50 MCG/ACT nasal spray       . hydrochlorothiazide (HYDRODIURIL) 25 MG tablet       . ibuprofen (ADVIL,MOTRIN) 200 MG tablet Take 200 mg by mouth every 6 (six) hours as needed. 2 po prn low back pain      . KLOR-CON M20 20 MEQ tablet       . loratadine (CLARITIN) 10 MG tablet Take 10 mg by mouth daily.        . tamoxifen (NOLVADEX) 20 MG tablet Take 1 tablet (20 mg total) by mouth daily.  90 tablet  1 yr   No current facility-administered medications for this encounter.   Facility-Administered Medications Ordered in Other Encounters  Medication Dose Route Frequency Provider Last Rate Last Dose  . hyaluronate sodium (RADIAPLEXRX) gel   Topical Once Jonna Coup, MD   1 application at 02/15/12 1728     ALLERGIES: Penicillins   LABORATORY DATA:  Lab Results  Component Value Date   WBC 9.8 11/07/2011   HGB 14.3 11/28/2011   HCT 42.0 11/28/2011   MCV 86.4 11/07/2011   PLT 325 11/07/2011   Lab Results  Component Value Date   NA 143 11/28/2011   K 3.0* 11/28/2011   CL 103 11/28/2011   CO2 27 11/14/2011   Lab Results  Component Value Date   ALT 16 11/07/2011   AST 17 11/07/2011   ALKPHOS 81 11/07/2011   BILITOT 0.3 11/07/2011      NARRATIVE: Donnita Farina was seen today for weekly treatment management. The chart was checked and port films images were reviewed. The patient is doing well overall. No major changes in her skin. She is nearing the completion of her treatment.  PHYSICAL EXAMINATION: vitals were not taken for this visit.    diffuse darkness and dryness  the skin is present in the treatment area. No moist desquamation   ASSESSMENT: Patient tolerating treatments well.    PLAN: Continue treatment as planned.

## 2012-02-18 ENCOUNTER — Ambulatory Visit
Admission: RE | Admit: 2012-02-18 | Discharge: 2012-02-18 | Disposition: A | Discharge status: Home / Self Care | Payer: BC Managed Care – PPO | Source: Ambulatory Visit | Attending: Radiation Oncology | Admitting: Radiation Oncology

## 2012-02-18 ENCOUNTER — Encounter: Payer: Self-pay | Admitting: Radiation Oncology

## 2012-02-18 NOTE — Progress Notes (Unsigned)
Patient dropped off [employer] forms.  Put in folder for Dr. Mitzi Hansen to complete and sign.   03/03/12 mailed back to patient today - she has a place she still needs to sign.  Made copy to scan.

## 2012-02-19 ENCOUNTER — Ambulatory Visit
Admission: RE | Admit: 2012-02-19 | Discharge: 2012-02-19 | Disposition: A | Discharge status: Home / Self Care | Payer: BC Managed Care – PPO | Source: Ambulatory Visit | Attending: Radiation Oncology | Admitting: Radiation Oncology

## 2012-02-20 ENCOUNTER — Ambulatory Visit
Admission: RE | Admit: 2012-02-20 | Discharge: 2012-02-20 | Disposition: A | Discharge status: Home / Self Care | Payer: BC Managed Care – PPO | Source: Ambulatory Visit | Attending: Radiation Oncology | Admitting: Radiation Oncology

## 2012-02-20 ENCOUNTER — Encounter: Payer: Self-pay | Admitting: Radiation Oncology

## 2012-02-20 VITALS — Wt 175.0 lb

## 2012-02-20 DIAGNOSIS — C50419 Malignant neoplasm of upper-outer quadrant of unspecified female breast: Secondary | ICD-10-CM

## 2012-02-20 NOTE — Progress Notes (Signed)
[facility] Radiation Oncology Weekly Treatment Note    Name: Penny Foster Date: 02/20/2012 MRN: [id number] DOB: Oct 03, 1951  Status: outpatient    Current dose: 61 gray  Current fraction: 33  Planned dose: 61 gray  Planned fraction: 33   MEDICATIONS: Current Outpatient Prescriptions  Medication Sig Dispense Refill  . diltiazem (DILACOR XR) 180 MG 24 hr capsule       . fluticasone (FLONASE) 50 MCG/ACT nasal spray       . hydrochlorothiazide (HYDRODIURIL) 25 MG tablet       . ibuprofen (ADVIL,MOTRIN) 200 MG tablet Take 200 mg by mouth every 6 (six) hours as needed. 2 po prn low back pain      . KLOR-CON M20 20 MEQ tablet       . loratadine (CLARITIN) 10 MG tablet Take 10 mg by mouth daily.        . tamoxifen (NOLVADEX) 20 MG tablet Take 1 tablet (20 mg total) by mouth daily.  90 tablet  1 yr     ALLERGIES: Penicillins   LABORATORY DATA:  Lab Results  Component Value Date   WBC 9.8 11/07/2011   HGB 14.3 11/28/2011   HCT 42.0 11/28/2011   MCV 86.4 11/07/2011   PLT 325 11/07/2011   Lab Results  Component Value Date   NA 143 11/28/2011   K 3.0* 11/28/2011   CL 103 11/28/2011   CO2 27 11/14/2011   Lab Results  Component Value Date   ALT 16 11/07/2011   AST 17 11/07/2011   ALKPHOS 81 11/07/2011   BILITOT 0.3 11/07/2011      NARRATIVE: Amaris Delafuente was seen today for weekly treatment management. The chart was checked and port films images were reviewed. The patient completed her treatment today. She has continued to do well. She reports no major changes in her skin.  PHYSICAL EXAMINATION: weight is 79.379 kg (175 lb).     diffuse increased pigmentation is present within the treatment area. There is some dry desquamation especially in the right axilla and underneath the right breast. No moist desquamation.   ASSESSMENT: Completes treatment today. Followup in one month.    PLAN: Completes treatment today. Followup in one month.

## 2012-02-20 NOTE — Progress Notes (Signed)
Pt completed tx today, no c/o. Has FU card.

## 2012-03-04 ENCOUNTER — Other Ambulatory Visit: Payer: Self-pay | Admitting: Radiation Oncology

## 2012-03-05 ENCOUNTER — Telehealth: Payer: Self-pay | Admitting: *Deleted

## 2012-03-05 ENCOUNTER — Encounter: Payer: Self-pay | Admitting: Radiation Oncology

## 2012-03-05 DIAGNOSIS — Z923 Personal history of irradiation: Secondary | ICD-10-CM | POA: Insufficient documentation

## 2012-03-05 NOTE — Telephone Encounter (Signed)
Returned vm from pt who states she feels very weak since finishing radiation tx. Pt completed radiation to breast on 02/20/12. She states "I did so well during treatment, but it hit me all at once". Pt states she "took very hot bath yesterday, stayed in a long time and fell when she got out of tub." Advised she use caution when bathing, not to stay in hot water too long. Pt verbalized understanding. Pt has FU w/Dr Mitzi Hansen tomorrow morning.

## 2012-03-06 ENCOUNTER — Ambulatory Visit
Admission: RE | Admit: 2012-03-06 | Discharge: 2012-03-06 | Disposition: A | Discharge status: Home / Self Care | Payer: BC Managed Care – PPO | Source: Ambulatory Visit | Attending: Radiation Oncology | Admitting: Radiation Oncology

## 2012-03-06 ENCOUNTER — Encounter: Payer: Self-pay | Admitting: Radiation Oncology

## 2012-03-06 VITALS — BP 110/70 | HR 78 | Temp 98.1°F | Resp 20 | Wt 169.7 lb

## 2012-03-06 DIAGNOSIS — C50519 Malignant neoplasm of lower-outer quadrant of unspecified female breast: Secondary | ICD-10-CM

## 2012-03-06 DIAGNOSIS — R110 Nausea: Secondary | ICD-10-CM | POA: Insufficient documentation

## 2012-03-06 DIAGNOSIS — Z79899 Other long term (current) drug therapy: Secondary | ICD-10-CM | POA: Insufficient documentation

## 2012-03-06 DIAGNOSIS — C50919 Malignant neoplasm of unspecified site of unspecified female breast: Secondary | ICD-10-CM | POA: Insufficient documentation

## 2012-03-06 DIAGNOSIS — R5383 Other fatigue: Secondary | ICD-10-CM | POA: Insufficient documentation

## 2012-03-06 DIAGNOSIS — R634 Abnormal weight loss: Secondary | ICD-10-CM | POA: Insufficient documentation

## 2012-03-06 DIAGNOSIS — C50419 Malignant neoplasm of upper-outer quadrant of unspecified female breast: Secondary | ICD-10-CM

## 2012-03-06 DIAGNOSIS — R5381 Other malaise: Secondary | ICD-10-CM | POA: Insufficient documentation

## 2012-03-06 DIAGNOSIS — Z923 Personal history of irradiation: Secondary | ICD-10-CM | POA: Insufficient documentation

## 2012-03-06 MED ORDER — PROCHLORPERAZINE MALEATE 10 MG PO TABS: 10.0000 mg | ORAL_TABLET | Freq: Four times a day (QID) | ORAL | Status: DC | PRN

## 2012-03-06 MED ORDER — RADIAPLEXRX EX GEL
Freq: Once | CUTANEOUS | Status: AC
  Administered 2012-03-06: 11:00:00 via TOPICAL

## 2012-03-06 NOTE — Progress Notes (Addendum)
Since completing radiation tx on 02/20/12 pt c/o fatigue, weakness, has missed 3 days of work due to this. Pt states she has to sit to  Perform ADLs. Pt reports loss of appetite, some episodes of nausea. Pt has lost 6 lbs past 2 weeks. She denies pain. Pt requested to see dr due to these issues.  Pt also requests a tube of Radiaplex stating lotions w/vit E have a scent that she cannot tolerate. Gave pt Radiaplex. Right breast w/dry desquamation, still hyperpigmented.  Discussed eating sources of protein, staying well hydrated to help w/her fatigue.

## 2012-03-06 NOTE — Progress Notes (Signed)
Radiation Oncology         [phone]) [id number] ________________________________  Name: Penny Foster MRN: [id number]  Date: 03/06/2012  DOB: 02-Jan-1951  Follow-Up Visit Note  CC: Penny Loron, MD, MD    Diagnosis:  Breast cancer  Interval Since Last Radiation:  2 weeks  Narrative:  The patient returns today for followup. She requested to come in today. The patient did talk to the on-call physician and discuss with her that she really did not feel well. She has been complaining of some generalized fatigue as well as some nausea and decreased appetite. The patient has lost 6 pounds over the last 2 weeks  The patient indicates that her skin is doing relatively well. She does request a refill for radioplex.                              ALLERGIES:  is allergic to penicillins.  Meds: Current Outpatient Prescriptions  Medication Sig Dispense Refill  . diltiazem (DILACOR XR) 180 MG 24 hr capsule       . fluticasone (FLONASE) 50 MCG/ACT nasal spray       . hydrochlorothiazide (HYDRODIURIL) 25 MG tablet       . ibuprofen (ADVIL,MOTRIN) 200 MG tablet Take 200 mg by mouth every 6 (six) hours as needed. 2 po prn low back pain      . KLOR-CON M20 20 MEQ tablet       . loratadine (CLARITIN) 10 MG tablet Take 10 mg by mouth daily.        . prochlorperazine (COMPAZINE) 10 MG tablet Take 1 tablet (10 mg total) by mouth every 6 (six) hours as needed.  30 tablet  0  . tamoxifen (NOLVADEX) 20 MG tablet Take 1 tablet (20 mg total) by mouth daily.  90 tablet  1 yr   Current Facility-Administered Medications  Medication Dose Route Frequency Provider Last Rate Last Dose  . hyaluronate sodium (RADIAPLEXRX) gel   Topical Once Jonna Coup, MD        Physical Findings: The patient is in no acute distress. Patient is alert and oriented.  weight is 169 lb 11.2 oz (76.975 kg). Her oral temperature is 98.1 F (36.7 C). Her blood pressure is 110/70 and her pulse is 78. Her respiration is 20.  The patient's  treatment area in the breast region shows some ongoing hyperpigmentation and diffuse dry desquamation. No moist desquamation present. The patient's skin appears to be healing adequately.  Lab Findings: Lab Results  Component Value Date   WBC 9.8 11/07/2011   HGB 14.3 11/28/2011   HCT 42.0 11/28/2011   MCV 86.4 11/07/2011   PLT 325 11/07/2011    Radiographic Findings: No results found.  Impression:  The patient is recovering from the effects of radiation.  He does complain of some nausea decreased appetite and fatigue.  Plan:  I discussed with the patient that her symptoms may be multifactorial. Her fatigue may certainly be related to her radiation treatment but I do not believe that this accounts for all of her symptoms. I certainly would not expect nausea. I have given her a prescription for nausea medication and have encouraged her to increase her by mouth intake, especially with regards to fluids. I believe that both the medicine and some changes will help make her feel better. The patient notes that she did have some sinus issues and was on antibiotics earlier. This may factor into her symptoms. I  believe that her symptoms will likely resolve hopefully within the next few days. The patient is to let us know if her symptoms persist or worsen.   Radene Gunning, M.D., Ph.D.

## 2012-03-13 ENCOUNTER — Other Ambulatory Visit: Payer: Self-pay | Admitting: *Deleted

## 2012-03-13 ENCOUNTER — Telehealth: Payer: Self-pay | Admitting: *Deleted

## 2012-03-13 DIAGNOSIS — C50919 Malignant neoplasm of unspecified site of unspecified female breast: Secondary | ICD-10-CM

## 2012-03-13 MED ORDER — CEPHALEXIN 500 MG PO CAPS: 500.0000 mg | ORAL_CAPSULE | Freq: Three times a day (TID) | ORAL | Status: AC

## 2012-03-13 NOTE — Telephone Encounter (Signed)
Pt called with concerns due to ongoing fatique and noted swollen breast which is hot to the touch.  Pt was seen by Dr Mitzi Hansen last week but breast was not as swollen. Per his advice she was seen by primary MD who put her on ABX for sinuses - Z pak.  Per discussion pt is requesting to see Dr Darnelle Catalan.  This note will be reveiwed with midlevel in MD absence.  Appointment made for follow up.

## 2012-03-17 ENCOUNTER — Telehealth: Payer: Self-pay | Admitting: Oncology

## 2012-03-17 ENCOUNTER — Ambulatory Visit (INDEPENDENT_AMBULATORY_CARE_PROVIDER_SITE_OTHER): Payer: BC Managed Care – PPO | Admitting: General Surgery

## 2012-03-17 ENCOUNTER — Ambulatory Visit (HOSPITAL_BASED_OUTPATIENT_CLINIC_OR_DEPARTMENT_OTHER): Payer: BC Managed Care – PPO | Admitting: Oncology

## 2012-03-17 ENCOUNTER — Ambulatory Visit (HOSPITAL_BASED_OUTPATIENT_CLINIC_OR_DEPARTMENT_OTHER): Payer: BC Managed Care – PPO | Admitting: Lab

## 2012-03-17 ENCOUNTER — Other Ambulatory Visit: Payer: Self-pay | Admitting: Oncology

## 2012-03-17 ENCOUNTER — Telehealth (INDEPENDENT_AMBULATORY_CARE_PROVIDER_SITE_OTHER): Payer: Self-pay | Admitting: General Surgery

## 2012-03-17 ENCOUNTER — Encounter (INDEPENDENT_AMBULATORY_CARE_PROVIDER_SITE_OTHER): Payer: Self-pay | Admitting: General Surgery

## 2012-03-17 VITALS — BP 128/80 | HR 70 | Temp 97.4°F | Resp 18 | Ht 62.0 in | Wt 167.1 lb

## 2012-03-17 VITALS — BP 133/84 | HR 75 | Temp 98.4°F | Ht 62.0 in | Wt 167.0 lb

## 2012-03-17 DIAGNOSIS — C50919 Malignant neoplasm of unspecified site of unspecified female breast: Secondary | ICD-10-CM

## 2012-03-17 DIAGNOSIS — C50419 Malignant neoplasm of upper-outer quadrant of unspecified female breast: Secondary | ICD-10-CM

## 2012-03-17 DIAGNOSIS — IMO0002 Reserved for concepts with insufficient information to code with codable children: Secondary | ICD-10-CM

## 2012-03-17 LAB — CBC WITH DIFFERENTIAL/PLATELET
Basophils Absolute: 0.1 10*3/uL (ref 0.0–0.1)
Eosinophils Absolute: 0.1 10*3/uL (ref 0.0–0.5)
HCT: 37.5 % (ref 34.8–46.6)
HGB: 12.6 g/dL (ref 11.6–15.9)
LYMPH%: 13.5 % — ABNORMAL LOW (ref 14.0–49.7)
MCV: 85.6 fL (ref 79.5–101.0)
MONO%: 8.1 % (ref 0.0–14.0)
NEUT#: 6.9 10*3/uL — ABNORMAL HIGH (ref 1.5–6.5)
NEUT%: 76.7 % (ref 38.4–76.8)
Platelets: 477 10*3/uL — ABNORMAL HIGH (ref 145–400)
RDW: 13.4 % (ref 11.2–14.5)

## 2012-03-17 LAB — COMPREHENSIVE METABOLIC PANEL
BUN: 10 mg/dL (ref 6–23)
CO2: 26 mEq/L (ref 19–32)
Glucose, Bld: 86 mg/dL (ref 70–99)
Sodium: 140 mEq/L (ref 135–145)
Total Bilirubin: 0.4 mg/dL (ref 0.3–1.2)
Total Protein: 7.7 g/dL (ref 6.0–8.3)

## 2012-03-17 NOTE — Telephone Encounter (Signed)
gve the pt's daughter the appt to see dr Dwain Sarna. The pt has been worked in for today

## 2012-03-17 NOTE — Progress Notes (Signed)
ID: Sila Sarsfield   DOB: [birth year]  MR#: [id number]  MVH#:[id number]  HISTORY OF PRESENT ILLNESS: The patient had a screening mammogram at [facility] in October 2012 showing new calcifications in the right breast. Repeat right diagnostic mammography on 10/04/2011 showed a cluster of heterogeneous calcifications spanning 2.4 cm. 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  With this information the patient was referred for bilateral breast MRI, performed 10/31/2011. This showed a 2 cm area of abnormal clumped nodular enhancement in the right breast 9:00 location extending posteriorly from the biopsy clip. There were no other areas of abnormal enhancement and no evidence of lymph node involvement.  The patient proceeded to right lumpectomyDecember 2012, the pathology showing in addition to her DCIS, 3 areas of invasion, the largest measuring 0.26 cm. The invasive disease was strongly estrogen receptor positive, progesterone receptor negative, but HER-2 amplified. Later the same month the patient proceeded to sentinel lymph node sampling, and this showed 4 sentinel lymph nodes to be clear.   INTERVAL HISTORY: Alfreda came today for an unscheduled visit. She has been having problems with her right breast, and she wanted to make sure we looked at it. She has gone back to work in fact she came in with her "lost" as well as her husband today.  REVIEW OF SYSTEMS: She just is not feeling well. She is tired, and mildly nauseated. She has seen Dr. Mitzi Hansen regarding this and he started her on some Compazine, which she has taken irregularly. She is not exercising-"I just don't have the energy". She was started on an antibiotic by Dr. Mitzi Hansen, but it did not agree with her and she stopped it. We started her on cephalexin about 5  days ago. She is taking that without significant side effects, but she also has not seen any improvement in the breast. She denies any fever, any right upper extremity swelling, or other symptoms. A detailed review of systems was otherwise noncontributory  PAST MEDICAL HISTORY: Past Medical History  Diagnosis Date  . Meniere disease   . Complication of anesthesia   . PONV (postoperative nausea and vomiting)   . Breast cancer 10/25/11    R breast bx- DCIS, ER/PR+  . Hypertension   . Hx of radiation therapy 01/08/12 to 02/20/12    R breast    PAST SURGICAL HISTORY: Past Surgical History  Procedure Date  . Inner ear surgery   . Cholecystectomy   . Appendectomy   . Breast lumpectomy 11/15/2011    Procedure: LUMPECTOMY;  Surgeon: [name], MD;  Location: [facility];  Service: General;  Laterality: Right;  right breast wire guided lumpectomy , bracketed  . Breast surgery     right axillary sentinel node biopsy    FAMILY HISTORY Family History  Problem Relation Age of Onset  . Cancer Sister     stomach  . Cancer Brother     lung  The patient's father died at the age of 84; her mother died at the age of 62. She has 4 brothers and 5 sisters. One sister had stomach cancer, one brother had lung cancer. There is no history of breast or ovarian cancer in the family.   GYNECOLOGIC HISTORY: Menarche age 45, first pregnancy to term age 88. She is GX P2. She does not recall how long ago she stopped  having periods. She never used hormone replacement or birth control pills.  SOCIAL HISTORY: She works as Research officer, political party. Her husband Celestina Gironda is a retired Art gallery manager although he rose to be a Warehouse manager of the company. Son Jaie [place] lives in [place] where he works in [employer]. Daughter Maria [place] is Interior and spatial designer of the [facility] group home for disabled adults program. The patient has one grandchild. She attends the [employer],    ADVANCED DIRECTIVES:  HEALTH MAINTENANCE: History  Substance Use Topics  . Smoking status: Never Smoker   . Smokeless tobacco: Never Used  . Alcohol Use: No     Colonoscopy:  PAP: 2012  Bone density: 2007  Lipid panel:   Allergies  Allergen Reactions  . Penicillins Nausea And Vomiting    Current Outpatient Prescriptions  Medication Sig Dispense Refill  . cephALEXin (KEFLEX) 500 MG capsule Take 1 capsule (500 mg total) by mouth 3 (three) times daily.  30 capsule  0  . diltiazem (DILACOR XR) 180 MG 24 hr capsule       . fluticasone (FLONASE) 50 MCG/ACT nasal spray       . hydrochlorothiazide (HYDRODIURIL) 25 MG tablet       . ibuprofen (ADVIL,MOTRIN) 200 MG tablet Take 200 mg by mouth every 6 (six) hours as needed. 2 po prn low back pain      . KLOR-CON M20 20 MEQ tablet       . loratadine (CLARITIN) 10 MG tablet Take 10 mg by mouth daily.        . prochlorperazine (COMPAZINE) 10 MG tablet Take 1 tablet (10 mg total) by mouth every 6 (six) hours as needed.  30 tablet  0  . tamoxifen (NOLVADEX) 20 MG tablet Take 1 tablet (20 mg total) by mouth daily.  90 tablet  1 yr    OBJECTIVE: Middle-aged [place] American woman who appears uncomfortable There were no vitals filed for this visit.   There is no height or weight on file to calculate BMI.    ECOG FS: 1  Sclerae unicteric Oropharynx clear No peripheral adenopathy Lungs no rales or rhonchi Heart regular rate and rhythm Abd benign MSK no focal spinal tenderness, no peripheral edema Neuro: nonfocal Breasts: The right breast is slightly hyperpigmented secondary to the recent radiation treatments. There is no desquamation. There is however an area of firmness in the inferior part of the breast consistent with a hematoma or seroma. I do not see frank evidence of cellulitis. The right axillary area is clear; the left breast is unremarkable  LAB RESULTS: Lab Results  Component Value Date   WBC 9.0 03/17/2012    NEUTROABS 6.9* 03/17/2012   HGB 12.6 03/17/2012   HCT 37.5 03/17/2012   MCV 85.6 03/17/2012   PLT 477* 03/17/2012      Chemistry      Component Value Date/Time   NA 143 11/28/2011 1206   K 3.0* 11/28/2011 1206   CL 103 11/28/2011 1206   CO2 27 11/14/2011 1400   BUN 13 11/28/2011 1206   CREATININE 1.00 11/28/2011 1206      Component Value Date/Time   CALCIUM 9.4 11/14/2011 1400   ALKPHOS 81 11/07/2011 1213   AST 17 11/07/2011 1213   ALT 16 11/07/2011 1213   BILITOT 0.3 11/07/2011 1213       No results found for this basename: LABCA2    No components found with this basename: [id number]    No results  found for this basename: INR:1;PROTIME:1 in the last 168 hours  Urinalysis No results found for this basename: colorurine, appearanceur, labspec, phurine, glucoseu, hgbur, bilirubinur, ketonesur, proteinur, urobilinogen, nitrite, leukocytesur    STUDIES: No results found.  ASSESSMENT: 61 year old [name] woman   (1) status post right breast biopsy 10/25/2011 for a high-grade ductal carcinoma in situ which was strongly estrogen and progesterone receptor positive  (2) definitive right lumpectomy December of 2012 showing microinvasive disease, the largest of 3 foci measuring 0.26 cm. The invasive disease was estrogen receptor and HER-2 positive, progesterone receptor negative.  (3) She had subsequent sentinel lymph node sampling  December of 2012, with all 4 sentinel lymph nodes negative, for a final stage of T1a N0 or stage IA   (4) radiation therapy completed March 2013   PLAN: I think Amory is having some residual side effects from her radiation, and is either collecting a seroma or having a hematoma in her right breast. I think it would be most useful as she saw her surgeon, Dr. Dwain Sarna, and we will try to arrange for that as soon as possible. She should complete her cephalexin as prescribed. I am not asking her to start the tamoxifen until all this has cleared.  The target date is May 1. We will consider rehabilitation at the next visit which will be later that same month.   Vernon Ariel C    03/17/2012

## 2012-03-17 NOTE — Progress Notes (Signed)
Subjective:     Patient ID: Penny Foster, female   DOB: [birth year], 61 y.o.   MRN: [id number]  HPI 56 yof s/p right breast lumpectomy/snbx in December who completed xrt about a month ago.  Due to start tamoxifen soon.  Has had some nausea, decreased energy for last month since completing xrt.  Over last 2 weeks has had increase in right breast size and tenderness.  She was seen today by Dr. Darnelle Catalan and referred over.    Review of Systems     Objective:   Physical Exam  Vitals reviewed. Constitutional: She appears well-developed and well-nourished.  Pulmonary/Chest: Right breast exhibits skin change (consistent with radiation changes). Right breast exhibits no inverted nipple.         Assessment:     Right breast seroma s/p lump/snbx in December after completing xrt recently    Plan:     I cleansed area and withdrew 120 cc of straw colored fluid.  This is not infected.  She will wear sports bra day and night for several days.  May need to do this again once or twice but hopefully will resolve quickly on own now.  I will see back in 2 weeks or sooner if recurs

## 2012-03-18 NOTE — Progress Notes (Signed)
Complex simulation note  Ms. Cangelosi has initially been planned to receive 45 gray which corresponds to a course of whole breast radiation. She will then receive a boost treatment to the seroma cavity. This will correspond to an additional 16 gray at 2 gray per fraction. To accomplish this boost treatment, 3 additional customized blocks have been designed. This will therefore correspond to a photon boost technique. A complex isodose plan is ordered to ensure that the seroma cavity has been adequately covered with radiation.

## 2012-03-18 NOTE — Progress Notes (Signed)
  Radiation Oncology         [phone] ________________________________  Name: Penny Foster MRN: [id number]  Date: 03/18/2012  DOB: 1951/11/03  End of Treatment Note  Diagnosis:   Right-sided breast cancer     Indication for treatment:  Curative       Radiation treatment dates:   01/07/2012 through 02/20/2012  Site/dose:   Whole breast radiation to 45 gray at 1.8 gray per fraction. This corresponded to a 2 field whole breast technique. The patient then received a three-field photon boost treatment to an additional 16 gray which targeted the seroma cavity. The final total dose was 61 gray.  Narrative: The patient tolerated radiation treatment relatively well.   She did experience some expected skin irritation towards the end of treatment. This consisted of diffuse hyperpigmentation and some dry desquamation.  Plan: The patient has completed radiation treatment. The patient will return to radiation oncology clinic for routine followup in one month. I advised the patient to call or return sooner if they have any questions or concerns related to their recovery or treatment. ________________________________  Radene Gunning, M.D., Ph.D.

## 2012-03-18 NOTE — Progress Notes (Signed)
[facility] Radiation Oncology Simulation and Treatment Planning Note   Name: Penny Foster MRN: [id number]  Date:  12/27/2011            DOB: 02-04-1951    DIAGNOSIS: Right-sided breast cancer     SET UP: Patient is setup supine   IMMOBILIZATION: Following immobilization is used:Custom Moldable Pillow   NARRATIVE: The patient was brought to the CT Simulation planning suite.  Identity was confirmed.  All relevant records and images related to the planned course of therapy were reviewed.  Then, the patient was positioned in a stable reproducible clinical set-up for radiation therapy.  CT images were obtained.  Skin markings were placed.  The CT images were loaded into the planning software where the target and avoidance structures were contoured.  The radiation prescription was entered and confirmed.   The patient will receive an initial course of whole breast radiation to 45 gray in 25 fractions. To accomplish this to customized blocks have been designed. A complex isodose plan is ordered for review to ensure that the target breast area is adequately covered with radiation dose. It is anticipated that the seroma cavity which has been contoured will then receive an additional 16 gray boost at 2 gray per fraction. The patient's final total dose therefore will be 61 gray.

## 2012-03-20 ENCOUNTER — Encounter: Payer: Self-pay | Admitting: Radiation Oncology

## 2012-04-04 ENCOUNTER — Ambulatory Visit: Payer: BC Managed Care – PPO | Admitting: Radiation Oncology

## 2012-04-14 ENCOUNTER — Ambulatory Visit (INDEPENDENT_AMBULATORY_CARE_PROVIDER_SITE_OTHER): Payer: BC Managed Care – PPO | Admitting: General Surgery

## 2012-04-14 ENCOUNTER — Encounter (INDEPENDENT_AMBULATORY_CARE_PROVIDER_SITE_OTHER): Payer: Self-pay | Admitting: General Surgery

## 2012-04-14 VITALS — BP 118/84 | HR 64 | Temp 96.9°F | Resp 18 | Ht 62.0 in | Wt 172.2 lb

## 2012-04-14 DIAGNOSIS — C50519 Malignant neoplasm of lower-outer quadrant of unspecified female breast: Secondary | ICD-10-CM

## 2012-04-14 NOTE — Progress Notes (Signed)
Subjective:     Patient ID: Penny Foster, female   DOB: July 10, 1951, 61 y.o.   MRN: [id number]  HPI  25 yof s/p right breast lumpectomy/snbx in December who completed xrt about a month ago. She is now on tamoxifen and tolerating well.  I saw her a month ago and drained a seroma.  This area has slowly increased but is not as bad as the first time.  It is not really symptomatic right now and she overall is feeling much better.  She comes in today to have this rechecked.  Review of Systems     Objective:   Physical Exam  Pulmonary/Chest: Right breast exhibits no nipple discharge, no skin change and no tenderness.         Assessment:     Seroma s/p lumpectomy    Plan:     I think this will slowly resolve on own now.  I don't think aspirating will help right now.  Will plan on seeing back in a month and may send for baseline u/s at that time. If she has other problems before then she will call.

## 2012-05-06 ENCOUNTER — Other Ambulatory Visit: Payer: BC Managed Care – PPO | Admitting: Lab

## 2012-05-06 ENCOUNTER — Ambulatory Visit: Payer: BC Managed Care – PPO | Admitting: Oncology

## 2012-05-06 ENCOUNTER — Other Ambulatory Visit: Payer: Self-pay | Admitting: *Deleted

## 2012-05-06 DIAGNOSIS — C50519 Malignant neoplasm of lower-outer quadrant of unspecified female breast: Secondary | ICD-10-CM

## 2012-05-06 NOTE — Progress Notes (Signed)
Pt called to follow up due to " not sure why I am scheduled for follow up ".  She states she is being seen often at surgeons and therefore was confused " when I received a call last week stating I had appt today ".  She was seen at her surgeon's who stated appointment with MD today at 2pm.  Nadra is unsure why she is scheduled for follow presently.  This RN reviewed pt's chart and noted appointment today was to assess toleration of Tamoxifen started May 1.  Above discussed as well as usual follow up appt schedule for at least 5 years.  Adrian states " don't want to jinx it ...but I am not having any problems ". Pt would prefer to not " see so many doctors at the same time"  Plan per discussion is pt will continue follow up with surgeon and appointment for this office will be made for July. Kierstan verbalized understanding to call if needed.

## 2012-05-12 ENCOUNTER — Ambulatory Visit (INDEPENDENT_AMBULATORY_CARE_PROVIDER_SITE_OTHER): Payer: BC Managed Care – PPO | Admitting: General Surgery

## 2012-05-12 ENCOUNTER — Encounter (INDEPENDENT_AMBULATORY_CARE_PROVIDER_SITE_OTHER): Payer: Self-pay | Admitting: General Surgery

## 2012-05-12 VITALS — BP 144/88 | HR 64 | Temp 98.6°F | Resp 14 | Ht 62.0 in | Wt 169.8 lb

## 2012-05-12 DIAGNOSIS — Z853 Personal history of malignant neoplasm of breast: Secondary | ICD-10-CM

## 2012-05-12 NOTE — Patient Instructions (Signed)

## 2012-05-12 NOTE — Progress Notes (Signed)
Subjective:     Patient ID: Penny Foster, female   DOB: 10/21/1951, 61 y.o.   MRN: [id number]  HPI This is a 37 yof who underwent lumpectomy/sn in December for stage 0 right breast cancer that was followed by xrt and now on tamoxifen tolerating that well.  She had a symptomatic seroma in her right lumpectomy cavity that I drained. This is now much improved without any further drainage.  She reports no complaints today. She is due to see Dr.Magrinat again in July.  Review of Systems     Objective:   Physical Exam  Pulmonary/Chest: Right breast exhibits no inverted nipple, no mass, no nipple discharge, no skin change and no tenderness.         Assessment:     S/p lumpectomy/snbx for right breast cancer Resolved seroma    Plan:     The seroma is resolved and I think she just has hardened tissue under scar from this.  I will plan on seeing back in a few months just to make sure this resolves.  We may get a baseline [place] and mmg this next visit.

## 2012-05-26 ENCOUNTER — Other Ambulatory Visit (HOSPITAL_BASED_OUTPATIENT_CLINIC_OR_DEPARTMENT_OTHER): Payer: BC Managed Care – PPO | Admitting: Lab

## 2012-05-26 ENCOUNTER — Other Ambulatory Visit: Payer: Self-pay | Admitting: Oncology

## 2012-05-26 ENCOUNTER — Ambulatory Visit (HOSPITAL_BASED_OUTPATIENT_CLINIC_OR_DEPARTMENT_OTHER): Payer: BC Managed Care – PPO | Admitting: Oncology

## 2012-05-26 ENCOUNTER — Telehealth: Payer: Self-pay | Admitting: *Deleted

## 2012-05-26 VITALS — BP 138/84 | HR 62 | Temp 97.4°F | Ht 62.0 in | Wt 168.7 lb

## 2012-05-26 DIAGNOSIS — C50419 Malignant neoplasm of upper-outer quadrant of unspecified female breast: Secondary | ICD-10-CM

## 2012-05-26 DIAGNOSIS — Z170 Estrogen receptor positive status [ER+]: Secondary | ICD-10-CM

## 2012-05-26 DIAGNOSIS — D0590 Unspecified type of carcinoma in situ of unspecified breast: Secondary | ICD-10-CM

## 2012-05-26 DIAGNOSIS — Z853 Personal history of malignant neoplasm of breast: Secondary | ICD-10-CM

## 2012-05-26 DIAGNOSIS — C50519 Malignant neoplasm of lower-outer quadrant of unspecified female breast: Secondary | ICD-10-CM

## 2012-05-26 LAB — CBC WITH DIFFERENTIAL/PLATELET
Eosinophils Absolute: 0.1 10*3/uL (ref 0.0–0.5)
LYMPH%: 34.4 % (ref 14.0–49.7)
MONO#: 0.4 10*3/uL (ref 0.1–0.9)
NEUT#: 3.4 10*3/uL (ref 1.5–6.5)
Platelets: 289 10*3/uL (ref 145–400)
RBC: 4.66 10*6/uL (ref 3.70–5.45)
RDW: 14.8 % — ABNORMAL HIGH (ref 11.2–14.5)
WBC: 6.2 10*3/uL (ref 3.9–10.3)

## 2012-05-26 LAB — COMPREHENSIVE METABOLIC PANEL
Albumin: 3.9 g/dL (ref 3.5–5.2)
CO2: 29 mEq/L (ref 19–32)
Calcium: 9.3 mg/dL (ref 8.4–10.5)
Glucose, Bld: 95 mg/dL (ref 70–99)
Potassium: 3.4 mEq/L — ABNORMAL LOW (ref 3.5–5.3)
Sodium: 142 mEq/L (ref 135–145)
Total Protein: 7.4 g/dL (ref 6.0–8.3)

## 2012-05-26 NOTE — Telephone Encounter (Signed)
Made patient appointment for mammogram at the breast center on 10-27-2012 at 10:00am made patient appointment for 10-27-2012 to see midlevel at 11:15am pritned out calendar and gave to the patient

## 2012-05-26 NOTE — Progress Notes (Signed)
ID: [name]   DOB: 06-30-1951  MR#: [id number]  WUJ#:[id number]  HISTORY OF PRESENT ILLNESS: The patient had a screening mammogram at [facility] in October 2012 showing new calcifications in the right breast. Repeat right diagnostic mammography on 10/04/2011 showed a cluster of heterogeneous calcifications spanning 2.4 cm. 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  With this information the patient was referred for bilateral breast MRI, performed 10/31/2011. This showed a 2 cm area of abnormal clumped nodular enhancement in the right breast 9:00 location extending posteriorly from the biopsy clip. There were no other areas of abnormal enhancement and no evidence of lymph node involvement.  The patient proceeded to right lumpectomyDecember 2012, the pathology showing in addition to her DCIS, 3 areas of invasion, the largest measuring 0.26 cm. The invasive disease was strongly estrogen receptor positive, progesterone receptor negative, but HER-2 amplified. Later the same month the patient proceeded to sentinel lymph node sampling, and this showed 4 sentinel lymph nodes to be clear.   INTERVAL HISTORY: Donnia came today with her husband Ian Bushman for followup of her breast cancer. After completing her radiation treatments, she started tamoxifen May 1. She did well until June 6 when she felt "weak, like I was going to pass out". She stopped the tamoxifen and saw her primary care physician who suggested some and anti-histamines. She didn't find that helpful. She did take some Motrin, and that took care of the problem. She has been reluctant however to resume the tamoxifen.  REVIEW OF SYSTEMS: Otherwise she is tolerating that medication well. In particular her hot flashes have not increased. There has not been  any vaginal wetness or dryness problems. She is back to work full-time. She is not exercising regularly however. A detailed review of systems today was otherwise noncontributory   PAST MEDICAL HISTORY: Past Medical History  Diagnosis Date  . Meniere disease   . Complication of anesthesia   . PONV (postoperative nausea and vomiting)   . Breast cancer 10/25/11    R breast bx- DCIS, ER/PR+  . Hypertension   . Hx of radiation therapy 01/08/12 to 02/20/12    R breast    PAST SURGICAL HISTORY: Past Surgical History  Procedure Date  . Inner ear surgery   . Cholecystectomy   . Appendectomy   . Breast lumpectomy 11/15/2011    Procedure: LUMPECTOMY;  Surgeon: [name], MD;  Location: [facility];  Service: General;  Laterality: Right;  right breast wire guided lumpectomy , bracketed  . Breast surgery     right axillary sentinel node biopsy    FAMILY HISTORY Family History  Problem Relation Age of Onset  . Cancer Sister     stomach  . Cancer Brother     lung  The patient's father died at the age of 46; her mother died at the age of 40. She has 4 brothers and 5 sisters. One sister had stomach cancer, one brother had lung cancer. There is no history of breast or ovarian cancer in the family.   GYNECOLOGIC HISTORY: Menarche age 28, first pregnancy to term age 38. She is GX P2. She does not recall how long ago she stopped having periods. She never used hormone replacement or birth control pills.  SOCIAL HISTORY: She works as Research officer, political party. Her husband Nayelie Gionfriddo is  a retired Art gallery manager although he rose to be a Warehouse manager of the company. Son Jaie [place] lives in [place] where he works in [employer]. Daughter Maria [place] is Interior and spatial designer of the [facility] group home for disabled adults program. The patient has one grandchild. She attends the [employer],    ADVANCED DIRECTIVES:  HEALTH MAINTENANCE: History  Substance Use  Topics  . Smoking status: Never Smoker   . Smokeless tobacco: Never Used  . Alcohol Use: No     Colonoscopy:  PAP: 2012  Bone density: 2007  Lipid panel:   Allergies  Allergen Reactions  . Penicillins Nausea And Vomiting    Current Outpatient Prescriptions  Medication Sig Dispense Refill  . diltiazem (DILACOR XR) 180 MG 24 hr capsule       . hydrochlorothiazide (HYDRODIURIL) 25 MG tablet       . ibuprofen (ADVIL,MOTRIN) 200 MG tablet Take 200 mg by mouth every 6 (six) hours as needed. 2 po prn low back pain      . fluticasone (FLONASE) 50 MCG/ACT nasal spray       . KLOR-CON M20 20 MEQ tablet       . loratadine (CLARITIN) 10 MG tablet Take 10 mg by mouth daily.        Marland Kitchen NASONEX 50 MCG/ACT nasal spray Ad lib.      . tamoxifen (NOLVADEX) 20 MG tablet Take 1 tablet (20 mg total) by mouth daily.  90 tablet  1 yr    OBJECTIVE: Middle-aged African American woman  in no acute distress Filed Vitals:   05/26/12 0900  BP: 138/84  Pulse: 62  Temp: 97.4 F (36.3 C)     Body mass index is 30.86 kg/(m^2).    ECOG FS: 0  Sclerae unicteric Oropharynx clear No peripheral adenopathy Lungs no rales or rhonchi Heart regular rate and rhythm Abd benign MSK no focal spinal tenderness, no peripheral edema Neuro: nonfocal Breasts: The right breast is slightly hyperpigmented secondary to the recent radiation treatments. The area of firmness in the under side of the breast is unchanged from prior exam.  The right axillary area is clear; the left breast is unremarkable  LAB RESULTS: Lab Results  Component Value Date   WBC 6.2 05/26/2012   NEUTROABS 3.4 05/26/2012   HGB 13.6 05/26/2012   HCT 39.6 05/26/2012   MCV 85.0 05/26/2012   PLT 289 05/26/2012      Chemistry      Component Value Date/Time   NA 140 03/17/2012 0915   K 3.7 03/17/2012 0915   CL 101 03/17/2012 0915   CO2 26 03/17/2012 0915   BUN 10 03/17/2012 0915   CREATININE 0.79 03/17/2012 0915      Component Value Date/Time   CALCIUM 9.2  03/17/2012 0915   ALKPHOS 122* 03/17/2012 0915   AST 17 03/17/2012 0915   ALT 13 03/17/2012 0915   BILITOT 0.4 03/17/2012 0915       No results found for this basename: LABCA2    No components found with this basename: LABCA125    No results found for this basename: INR:1;PROTIME:1 in the last 168 hours  Urinalysis No results found for this basename: colorurine,  appearanceur,  labspec,  phurine,  glucoseu,  hgbur,  bilirubinur,  ketonesur,  proteinur,  urobilinogen,  nitrite,  leukocytesur    STUDIES: No results found.  ASSESSMENT: 61 y.o.  [place] woman   (1) status post right breast biopsy 10/25/2011 for a  high-grade ductal carcinoma in situ which was strongly estrogen and progesterone receptor positive  (2) definitive right lumpectomy December of 2012 showing microinvasive disease, the largest of 3 foci measuring 0.26 cm. The invasive disease was estrogen receptor and HER-2 positive, progesterone receptor negative.  (3) She had subsequent sentinel lymph node sampling  December of 2012, with all 4 sentinel lymph nodes negative, for a final stage of T1a N0 or stage IA   (4) radiation therapy completed March 2013  (5) tamoxifen started 04/09/2012   PLAN: We talked about tamoxifen metabolism and she understands the drug is largely protein pounds. Accordingly he takes approximately 4 months to reach a steady state level. Similarly when the drug is stopped it takes about 4 months for her to get out of 1 system.  Accordingly since her symptoms got better after a couple of days, and she had just as much tamoxifen in her body before and after the incident, whatever the symptoms were due to, tamoxifen was not the cause. We discussed that at length so she understands the situation. We certainly have many options that we could switch to, but I really don't see any reason to switch so she is going back on tamoxifen and she will let us know if she has any problems further with  that medication.  She will see Dr. Dwain Sarna in August. She will see [place] again in October the same day as her next mammogram. She knows to call for any problems that may develop before that visit.  Kada Friesen C    05/26/2012

## 2012-06-16 ENCOUNTER — Other Ambulatory Visit: Payer: Self-pay | Admitting: *Deleted

## 2012-06-16 ENCOUNTER — Telehealth: Payer: Self-pay | Admitting: *Deleted

## 2012-06-16 MED ORDER — VENLAFAXINE HCL ER 37.5 MG PO CP24: 37.5000 mg | ORAL_CAPSULE | Freq: Every day | ORAL | Status: DC

## 2012-06-16 NOTE — Telephone Encounter (Signed)
Pt left message stating she is on Tamoxifen and is having multiple symptoms including- " feeling lousy, hot flashes, nausea and nervousness "  " is there something Dr Darnelle Catalan could give me to help with these symptoms ".  Return call number given as [phone]

## 2012-06-16 NOTE — Telephone Encounter (Signed)
Per MD review recommendation is to call in effexor.  Discussed with pt.  Prescription called into [facility].

## 2012-06-18 ENCOUNTER — Telehealth: Payer: Self-pay | Admitting: *Deleted

## 2012-06-18 ENCOUNTER — Ambulatory Visit (HOSPITAL_COMMUNITY): Admission: RE | Admit: 2012-06-18 | Payer: BC Managed Care – PPO | Source: Home / Self Care | Admitting: Psychiatry

## 2012-06-18 ENCOUNTER — Other Ambulatory Visit: Payer: Self-pay | Admitting: Oncology

## 2012-06-18 NOTE — Progress Notes (Signed)
Penny Foster called today to say that she was very depressed upper the family was very concerned. We referred them to behavioral health but they offered admission and Penny Foster refused. She came over here and I met briefly with her, her husband and her daughter.  Penny Foster really does not give the appearance of being depressed. She does give the appearance of being quite bothered by the hot flashes and other side effects she is likely having from her antiestrogen therapy. I would we have done is stop back, and upper her venlafaxine to 75 mg. We offered to make her an appointment with the psychiatrist but she refused that she wanted to come here for counseling. We are setting her up with our chaplain for further discussion. My expectation is that within the next 3 weeks as her hot flashes improved in the venlafaxine takes of cold as she will be feeling considerably better. I am making her a return appointment with me in a few weeks so we can decide on what to do regarding further antiestrogen therapy.

## 2012-06-18 NOTE — Telephone Encounter (Signed)
Message left by pt's husband stating concern due to " Penny Foster is not doing well "  Reporting per VM " in bed, hot, nauseated, not drinking or eating "  This RN returned call and spoke with pt. Discussed above symptoms as well as call on Monday. This RN clarified above symptoms not acute as in a viral episode. Per inquiry pt " just don't feel good emotionally...just want to feel better "  Pt states no energy, not wanting to eat or drink - she stays in bed a lot and does not want to leave the home.  Pt denies current feelings of self harm.  Per MD review recommended for pt to proceed to behavioral health for appropriate assessment and intervention.  Note pt was started on Effexor XR 37.5mg  on 06/16/2012 per this office.  Above recommendation discussed with pt who verbalized agreement to see as well as again verified pt has no current suicidal ideation.

## 2012-06-19 ENCOUNTER — Telehealth: Payer: Self-pay | Admitting: *Deleted

## 2012-06-19 NOTE — Telephone Encounter (Signed)
patient confirmed over the phone the new date and time on 08-04-2012 at 3:00pm

## 2012-06-24 ENCOUNTER — Ambulatory Visit: Payer: BC Managed Care – PPO | Admitting: Oncology

## 2012-06-24 ENCOUNTER — Other Ambulatory Visit: Payer: BC Managed Care – PPO | Admitting: Lab

## 2012-06-30 ENCOUNTER — Other Ambulatory Visit: Payer: Self-pay | Admitting: *Deleted

## 2012-06-30 MED ORDER — VENLAFAXINE HCL ER 75 MG PO CP24: 75.0000 mg | ORAL_CAPSULE | Freq: Every day | ORAL | Status: DC

## 2012-07-08 ENCOUNTER — Telehealth: Payer: Self-pay | Admitting: *Deleted

## 2012-08-04 ENCOUNTER — Telehealth: Payer: Self-pay | Admitting: Oncology

## 2012-08-04 ENCOUNTER — Ambulatory Visit (HOSPITAL_BASED_OUTPATIENT_CLINIC_OR_DEPARTMENT_OTHER): Payer: BC Managed Care – PPO | Admitting: Oncology

## 2012-08-04 VITALS — BP 163/143 | HR 73 | Temp 98.1°F | Resp 20 | Ht 62.0 in | Wt 154.5 lb

## 2012-08-04 DIAGNOSIS — C50419 Malignant neoplasm of upper-outer quadrant of unspecified female breast: Secondary | ICD-10-CM

## 2012-08-04 DIAGNOSIS — Z170 Estrogen receptor positive status [ER+]: Secondary | ICD-10-CM

## 2012-08-04 DIAGNOSIS — H918X9 Other specified hearing loss, unspecified ear: Secondary | ICD-10-CM

## 2012-08-04 DIAGNOSIS — H8109 Meniere's disease, unspecified ear: Secondary | ICD-10-CM

## 2012-08-04 DIAGNOSIS — C50519 Malignant neoplasm of lower-outer quadrant of unspecified female breast: Secondary | ICD-10-CM

## 2012-08-04 NOTE — Progress Notes (Signed)
ID: [name]   DOB: 11/25/1951  MR#: [id number]  NWG#:[id number]  HISTORY OF PRESENT ILLNESS: The patient had a screening mammogram at [facility] in October 2012 showing new calcifications in the right breast. Repeat right diagnostic mammography on 10/04/2011 showed a cluster of heterogeneous calcifications spanning 2.4 cm. 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  With this information the patient was referred for bilateral breast MRI, performed 10/31/2011. This showed a 2 cm area of abnormal clumped nodular enhancement in the right breast 9:00 location extending posteriorly from the biopsy clip. There were no other areas of abnormal enhancement and no evidence of lymph node involvement.  The patient proceeded to right lumpectomyDecember 2012, the pathology showing in addition to her DCIS, 3 areas of invasion, the largest measuring 0.26 cm. The invasive disease was strongly estrogen receptor positive, progesterone receptor negative, but HER-2 amplified. Later the same month the patient proceeded to sentinel lymph node sampling, and this showed 4 sentinel lymph nodes to be clear.   INTERVAL HISTORY: Penny Foster came today with her husband Penny Foster for followup of her breast cancer. Since her last visit here, her symptoms of imbalance, nausea, fatigue, dizziness, and feeling "sick in my head", as well as hot flashes, have continued. She has been evaluated by her primary care physician Penny Foster in [place] who she tells me did a significant amount of lab work and also some abdominal ultrasonography, all of which she tells me was negative. She also saw and year nose and throat physician at [facility], Penny Foster, and the patient understands her out to have set that what she had at present is the route fruit  dozens of her Mnire's disease. The patient also was evaluated at the [facility] emergency room, where she was found to be hypokalemic, and she was started on prednisone. She took this for a week without significant improvement.  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 She tells me she is really not interested in support services. She is able to walk 30 minutes in the morning and 30 minutes in the evening, with some balance problems, but no falls (but took her to the emergency room was not a fall but near syncopal episode). A detailed review of systems today was otherwise negative except as noted above.  PAST MEDICAL HISTORY: Past Medical History  Diagnosis Date  . Meniere disease   . Complication of anesthesia   . PONV (postoperative nausea and vomiting)   . Breast cancer 10/25/11    R breast bx- DCIS, ER/PR+  . Hypertension   . Hx of radiation therapy 01/08/12 to 02/20/12    R breast    PAST SURGICAL HISTORY: Past Surgical History  Procedure Date  . Inner ear surgery   . Cholecystectomy   . Appendectomy   . Breast lumpectomy 11/15/2011    Procedure: LUMPECTOMY;  Surgeon: [name], MD;  Location:  SURGERY CENTER;  Service: General;  Laterality: Right;  right breast wire guided lumpectomy , bracketed  . Breast surgery     right axillary sentinel node biopsy    FAMILY HISTORY Family History  Problem Relation Age of Onset  . Cancer Sister  stomach  . Cancer Brother     lung  The patient's father died at the age of 72; her mother died at the age of 27. She has 4 brothers and 5 sisters. One sister had stomach cancer, one brother had lung cancer. There is no history of breast or ovarian cancer in the family.   GYNECOLOGIC HISTORY: Menarche age 7, first pregnancy to term age 62. She is GX P2. She does not  recall how long ago she stopped having periods. She never used hormone replacement or birth control pills.  SOCIAL HISTORY: She works as Research officer, political party. Her husband Penny Foster is a retired Art gallery manager although he rose to be a Warehouse manager of the company. Son Penny Foster lives in [place] where he works in [employer]. Daughter Penny Foster is Interior and spatial designer of the [facility] group home for disabled adults program. The patient has one grandchild. She attends the [employer],    ADVANCED DIRECTIVES:  HEALTH MAINTENANCE: History  Substance Use Topics  . Smoking status: Never Smoker   . Smokeless tobacco: Never Used  . Alcohol Use: No     Colonoscopy:  PAP: 2012  Bone density: 2007  Lipid panel:   Allergies  Allergen Reactions  . Penicillins Nausea And Vomiting    Current Outpatient Prescriptions  Medication Sig Dispense Refill  . diltiazem (DILACOR XR) 180 MG 24 hr capsule       . hydrochlorothiazide (HYDRODIURIL) 25 MG tablet       . ibuprofen (ADVIL,MOTRIN) 200 MG tablet Take 200 mg by mouth every 6 (six) hours as needed. 2 po prn low back pain      . KLOR-CON M20 20 MEQ tablet       . LORazepam (ATIVAN) 1 MG tablet       . NASONEX 50 MCG/ACT nasal spray Ad lib.      Marland Kitchen ondansetron (ZOFRAN-ODT) 4 MG disintegrating tablet       . fluticasone (FLONASE) 50 MCG/ACT nasal spray       . loratadine (CLARITIN) 10 MG tablet Take 10 mg by mouth daily.        . tamoxifen (NOLVADEX) 20 MG tablet Take 1 tablet (20 mg total) by mouth daily.  90 tablet  1 yr  . venlafaxine XR (EFFEXOR-XR) 75 MG 24 hr capsule Take 1 capsule (75 mg total) by mouth daily.  30 capsule  3    OBJECTIVE: Middle-aged African American woman  Filed Vitals:   08/04/12 1505  BP: 163/143  Pulse: 73  Temp: 98.1 F (36.7 C)  Resp: 20     Body mass index is 28.26 kg/(m^2).    ECOG FS: 0  Sclerae unicteric Oropharynx clear No peripheral adenopathy Lungs no rales or rhonchi Heart  regular rate and rhythm, no murmur appreciated Abd soft, nontender, positive bowel sounds, no organomegaly MSK no focal spinal tenderness, no peripheral edema Neuro: Extraocular movements are full. Pupils are equal. Motor is 5 over 5 throughout. She is able to do tandem walk with some difficulty, but without falling. She had a negative Romberg Breasts: The right breast is slightly hyperpigmented secondary to the recent radiation treatments. The area of firmness in the under side of the breast is unchanged from prior exam.  The right axillary area is clear; the left breast is unremarkable  LAB RESULTS: Lab Results  Component Value Date   WBC 6.2 05/26/2012   NEUTROABS 3.4 05/26/2012   HGB 13.6 05/26/2012   HCT 39.6  05/26/2012   MCV 85.0 05/26/2012   PLT 289 05/26/2012      Chemistry      Component Value Date/Time   NA 142 05/26/2012 0849   K 3.4* 05/26/2012 0849   CL 104 05/26/2012 0849   CO2 29 05/26/2012 0849   BUN 16 05/26/2012 0849   CREATININE 0.84 05/26/2012 0849      Component Value Date/Time   CALCIUM 9.3 05/26/2012 0849   ALKPHOS 63 05/26/2012 0849   AST 16 05/26/2012 0849   ALT 19 05/26/2012 0849   BILITOT 0.4 05/26/2012 0849       No results found for this basename: LABCA2    No components found with this basename: LABCA125    No results found for this basename: INR:1;PROTIME:1 in the last 168 hours  Urinalysis No results found for this basename: colorurine,  appearanceur,  labspec,  phurine,  glucoseu,  hgbur,  bilirubinur,  ketonesur,  proteinur,  urobilinogen,  nitrite,  leukocytesur    STUDIES: No results found.  ASSESSMENT: 61 y.o.  [place] woman   (1) status post right breast biopsy 10/25/2011 for a high-grade ductal carcinoma in situ which was strongly estrogen and progesterone receptor positive  (2) definitive right lumpectomy December of 2012 showing microinvasive disease, the largest of 3 foci measuring 0.26 cm. The invasive disease was  estrogen receptor and HER-2 positive, progesterone receptor negative.  (3) She had subsequent sentinel lymph node sampling  December of 2012, with all 4 sentinel lymph nodes negative, for a final stage of T1a N0 or stage IA   (4) radiation therapy completed March 2013  (5) tamoxifen started 04/09/2012, stopped after approximately 6 weeks due to the balance and other symptoms described above   PLAN: The patient understands the physicians at [facility] to have told her that tamoxifen was the cause of her balance problem. Certainly anything is possible, but in 2 decades of using tamoxifen I have not had 1 patient tells me that it caused vertigo. Certainly if taking tamoxifen for 6 weeks cause symptoms, being off it for another 6 weeks (as she has) should constantly some improvement in symptoms, and she tells me there has been no improvement.  In short I am not convinced that tamoxifen is the cause of her balance and other symptoms, but I do agree that we need to stop it, as well as the Effexor (which certainly can cause dizziness) and she has a ready done that. Once she has resolved this problem we cancer down and discuss alternatives.  She has a ready had 3 evaluation, and Dr. Bobbye Charleston, at [facility], and at the [facility] emergency room, and I don't want to repeat any of those studies. Accordingly we will do our best to get those results before she returns to see me in one month. She has not had a brain MRI, and although peripheral causes of vertigo are much more common than central ones, certainly in a patient with breast cancer one has to be concerned I am setting her up for a brain MRI in the near future. She will call within 24 hours of the test to get those results.  Otherwise she will see me again in 4 weeks. I am hopeful by then she will a ready be feeling better. If so we can consider antiestrogen therapy to start at some point in the future, perhaps next year. Tamia Dial C     08/04/2012

## 2012-08-04 NOTE — Telephone Encounter (Signed)
gve the pt her oct appt  Calendar along with the mri head appt in aug

## 2012-08-08 ENCOUNTER — Ambulatory Visit (HOSPITAL_COMMUNITY)
Admission: RE | Admit: 2012-08-08 | Discharge: 2012-08-08 | Disposition: A | Discharge status: Home / Self Care | Payer: BC Managed Care – PPO | Source: Ambulatory Visit | Attending: Oncology | Admitting: Oncology

## 2012-08-08 ENCOUNTER — Encounter (INDEPENDENT_AMBULATORY_CARE_PROVIDER_SITE_OTHER): Payer: BC Managed Care – PPO | Admitting: General Surgery

## 2012-08-08 DIAGNOSIS — G930 Cerebral cysts: Secondary | ICD-10-CM | POA: Insufficient documentation

## 2012-08-08 DIAGNOSIS — Q283 Other malformations of cerebral vessels: Secondary | ICD-10-CM | POA: Insufficient documentation

## 2012-08-08 DIAGNOSIS — C50419 Malignant neoplasm of upper-outer quadrant of unspecified female breast: Secondary | ICD-10-CM | POA: Insufficient documentation

## 2012-08-08 DIAGNOSIS — R4182 Altered mental status, unspecified: Secondary | ICD-10-CM | POA: Insufficient documentation

## 2012-08-08 MED ORDER — GADOBENATE DIMEGLUMINE 529 MG/ML IV SOLN
15.0000 mL | Freq: Once | INTRAVENOUS | Status: AC | PRN
  Administered 2012-08-08: 14 mL via INTRAVENOUS

## 2012-08-12 LAB — POCT I-STAT, CHEM 8
HCT: 36 % (ref 36.0–46.0)
Hemoglobin: 12.2 g/dL (ref 12.0–15.0)
Potassium: 3 mEq/L — ABNORMAL LOW (ref 3.5–5.1)
Sodium: 141 mEq/L (ref 135–145)

## 2012-08-15 ENCOUNTER — Other Ambulatory Visit: Payer: Self-pay | Admitting: *Deleted

## 2012-08-17 ENCOUNTER — Other Ambulatory Visit: Payer: Self-pay | Admitting: Oncology

## 2012-08-18 ENCOUNTER — Other Ambulatory Visit: Payer: Self-pay | Admitting: Physician Assistant

## 2012-08-18 DIAGNOSIS — C50519 Malignant neoplasm of lower-outer quadrant of unspecified female breast: Secondary | ICD-10-CM

## 2012-08-19 ENCOUNTER — Other Ambulatory Visit: Payer: Self-pay | Admitting: *Deleted

## 2012-08-19 ENCOUNTER — Telehealth: Payer: Self-pay | Admitting: *Deleted

## 2012-08-20 ENCOUNTER — Telehealth: Payer: Self-pay | Admitting: *Deleted

## 2012-08-20 NOTE — Telephone Encounter (Signed)
Gave patient appointment for bone scan on 08-22-2012 arrival time 11:45am

## 2012-08-21 ENCOUNTER — Other Ambulatory Visit: Payer: Self-pay | Admitting: Oncology

## 2012-08-22 ENCOUNTER — Encounter (HOSPITAL_COMMUNITY)
Admission: RE | Admit: 2012-08-22 | Discharge: 2012-08-22 | Disposition: A | Discharge status: Home / Self Care | Payer: BC Managed Care – PPO | Source: Ambulatory Visit | Attending: Physician Assistant | Admitting: Physician Assistant

## 2012-08-22 DIAGNOSIS — C50519 Malignant neoplasm of lower-outer quadrant of unspecified female breast: Secondary | ICD-10-CM | POA: Insufficient documentation

## 2012-08-22 MED ORDER — TECHNETIUM TC 99M MEDRONATE IV KIT
27.0000 | PACK | Freq: Once | INTRAVENOUS | Status: AC | PRN
  Administered 2012-08-22: 27 via INTRAVENOUS

## 2012-08-25 ENCOUNTER — Other Ambulatory Visit: Payer: Self-pay | Admitting: Oncology

## 2012-09-04 ENCOUNTER — Ambulatory Visit: Payer: Self-pay | Admitting: Psychiatry

## 2012-09-08 ENCOUNTER — Ambulatory Visit (INDEPENDENT_AMBULATORY_CARE_PROVIDER_SITE_OTHER): Payer: BC Managed Care – PPO | Admitting: General Surgery

## 2012-09-08 ENCOUNTER — Encounter (INDEPENDENT_AMBULATORY_CARE_PROVIDER_SITE_OTHER): Payer: Self-pay | Admitting: General Surgery

## 2012-09-08 VITALS — BP 130/88 | HR 79 | Temp 97.6°F | Ht 62.0 in | Wt 150.4 lb

## 2012-09-08 DIAGNOSIS — Z853 Personal history of malignant neoplasm of breast: Secondary | ICD-10-CM

## 2012-09-08 NOTE — Progress Notes (Signed)
Subjective:     Patient ID: Penny Foster, female   DOB: [birth year], 62 y.o.   MRN: [id number]  HPI This is a 61 year old female who I know well from a right breast lumpectomy and a right axillary sentinel lymph node biopsy. This is a T1 N0 tumor. This was estrogen receptor positive and progesterone receptor negative. HER-2/neu was amplified. She did well from her surgery. She then proceeded to radiation therapy and then underwent tamoxifen with medical oncology.she is due for her mammogram in November. After she began of tamoxifen she has had a lot of different side effects that she believes is likely due to the tamoxifen. She has had hot flashes, nausea, anxiety, and decreased energy. She has not worked for a fair amount of time as well. She has undergone a brain MRI as well as a bone scan which both appeared to be essentially negative for breast cancer as the source of the symptoms. She been off the tamoxifen now for 13 weeks with really no improvement in her symptoms. She also had a prior seroma that drained the she's had no recurrence of now. She comes in today for followup.   Review of Systems     Objective:   Physical Exam  Vitals reviewed. Constitutional: She appears well-developed and well-nourished.  Pulmonary/Chest: Right breast exhibits skin change (secondary to xrt). Right breast exhibits no inverted nipple, no mass, no nipple discharge and no tenderness. Left breast exhibits no inverted nipple, no mass, no nipple discharge, no skin change and no tenderness. Breasts are symmetrical.    Lymphadenopathy:    She has no cervical adenopathy.    She has no axillary adenopathy.       Right: No supraclavicular adenopathy present.       Left: No supraclavicular adenopathy present.       Assessment:     History of stage I right breast cancer    Plan:     She does not have any evidence of a clinical recurrence. She will get her mammogram next month. She's going to continue her monthly  self exams, six-month clinical exams. She's had no improvement in symptoms that she is seen medical oncology for. She is due to see Dr. Darnelle Catalan on October 10 to see if there is anything else they could do to try to make her a little bit better.

## 2012-09-18 ENCOUNTER — Ambulatory Visit (HOSPITAL_BASED_OUTPATIENT_CLINIC_OR_DEPARTMENT_OTHER): Payer: BC Managed Care – PPO | Admitting: Oncology

## 2012-09-18 VITALS — BP 130/83 | HR 74 | Temp 98.4°F | Resp 20 | Ht 62.0 in | Wt 153.0 lb

## 2012-09-18 DIAGNOSIS — Z170 Estrogen receptor positive status [ER+]: Secondary | ICD-10-CM

## 2012-09-18 DIAGNOSIS — C50419 Malignant neoplasm of upper-outer quadrant of unspecified female breast: Secondary | ICD-10-CM

## 2012-09-18 DIAGNOSIS — D0590 Unspecified type of carcinoma in situ of unspecified breast: Secondary | ICD-10-CM

## 2012-09-18 NOTE — Progress Notes (Signed)
ID: Penny Foster   DOB: 24-Oct-1951  MR#: [id number]  GEX#:[id number]  PCP: Barron Alvine, MD SUEmelia Loron MD GYN: OTHER MD:   HISTORY OF PRESENT ILLNESS: The patient had a screening mammogram at [facility] in October 2012 showing new calcifications in the right breast. Repeat right diagnostic mammography on 10/04/2011 showed a cluster of heterogeneous calcifications spanning 2.4 cm. 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  With this information the patient was referred for bilateral breast MRI, performed 10/31/2011. This showed a 2 cm area of abnormal clumped nodular enhancement in the right breast 9:00 location extending posteriorly from the biopsy clip. There were no other areas of abnormal enhancement and no evidence of lymph node involvement.  The patient proceeded to right lumpectomyDecember 2012, the pathology showing in addition to her DCIS, 3 areas of invasion, the largest measuring 0.26 cm. The invasive disease was strongly estrogen receptor positive, progesterone receptor negative, but HER-2 amplified. Later the same month the patient proceeded to sentinel lymph node sampling, and this showed 4 sentinel lymph nodes to be clear.   INTERVAL HISTORY: Penny Foster came today with her husband Penny Foster for followup of her breast cancer. Since her last visit here she has participated in our "finding your new normal" her gram, and she is not only enjoying at a great deal about getting quite a bit out of it.  REVIEW OF SYSTEMS: Thankfully her dizziness has resolved. She still having hot flashes but she feels they are better. Instead of waking her up at 6 in the morning they wake her up more at 7:30 in the morning. She is still fatigued, but she is walking at least 30 minutes almost every day. She  has not been able to get back to work. She she can feel anxious at times, and usually takes a lorazepam around breakfast time, which is very helpful. She then goes and does all her normal activities without unusual somnolence. She has no nausea, and of her up taste alteration has resolved. She did lose some weight during this period and she very much would like to keep it off if she could, so she is being careful with your diet. Otherwise a detailed review of systems is noncontributory  PAST MEDICAL HISTORY: Past Medical History  Diagnosis Date  . Meniere disease   . Complication of anesthesia   . PONV (postoperative nausea and vomiting)   . Breast cancer 10/25/11    R breast bx- DCIS, ER/PR+  . Hypertension   . Hx of radiation therapy 01/08/12 to 02/20/12    R breast    PAST SURGICAL HISTORY: Past Surgical History  Procedure Date  . Inner ear surgery   . Cholecystectomy   . Appendectomy   . Breast lumpectomy 11/15/2011    Procedure: LUMPECTOMY;  Surgeon: [name], MD;  Location: [facility];  Service: General;  Laterality: Right;  right breast wire guided lumpectomy , bracketed  . Breast surgery     right axillary sentinel node biopsy    FAMILY HISTORY Family History  Problem Relation Age of Onset  . Cancer Sister     stomach  . Cancer Brother     lung  The patient's father died at the age of 47; her mother died at the age of 24. She has 4 brothers and 5 sisters. One  sister had stomach cancer, one brother had lung cancer. There is no history of breast or ovarian cancer in the family.   GYNECOLOGIC HISTORY: Menarche age 13, first pregnancy to term age 74. She is GX P2. She does not recall how long ago she stopped having periods. She never used hormone replacement or birth control pills.  SOCIAL HISTORY: She worked as Research officer, political party. Her husband Shey Yott is a retired Art gallery manager although he rose to be a Warehouse manager of the company.  Son Jaie [place] lives in [place] where he works in [employer]. Daughter Maria [place] is Interior and spatial designer of the [facility] group home for disabled adults program. The patient has one grandchild. She attends the [employer],    ADVANCED DIRECTIVES:  HEALTH MAINTENANCE: History  Substance Use Topics  . Smoking status: Never Smoker   . Smokeless tobacco: Never Used  . Alcohol Use: No     Colonoscopy:  PAP: 2012  Bone density: 2007  Lipid panel:   Allergies  Allergen Reactions  . Penicillins Nausea And Vomiting    Current Outpatient Prescriptions  Medication Sig Dispense Refill  . ASTEPRO 0.15 % SOLN       . diltiazem (DILACOR XR) 180 MG 24 hr capsule       . hydrochlorothiazide (HYDRODIURIL) 25 MG tablet       . ibuprofen (ADVIL,MOTRIN) 200 MG tablet Take 200 mg by mouth every 6 (six) hours as needed. 2 po prn low back pain      . KLOR-CON M20 20 MEQ tablet Take 20 mEq by mouth 2 (two) times daily.       Marland Kitchen LORazepam (ATIVAN) 1 MG tablet       . ondansetron (ZOFRAN-ODT) 4 MG disintegrating tablet         OBJECTIVE: Middle-aged [place] American woman  who appears well Filed Vitals:   09/18/12 1445  BP: 130/83  Pulse: 74  Temp: 98.4 F (36.9 C)  Resp: 20     Body mass index is 27.98 kg/(m^2).    ECOG FS: 0  Sclerae unicteric Oropharynx clear No peripheral adenopathy Lungs no rales or rhonchi Heart regular rate and rhythm, no murmur appreciated Abd soft, nontender, positive bowel sounds, no organomegaly MSK no focal spinal tenderness, no peripheral edema Neuro: Nonfocal Breasts: The right breast is slightly hyperpigmented secondary to the recent radiation treatments. The area of firmness in the under side of the breast is unchanged from prior exam.  The right axillary area is clear; the left breast is unremarkable  LAB RESULTS: Lab Results  Component Value Date   WBC 6.2 05/26/2012   NEUTROABS 3.4 05/26/2012   HGB 12.2 08/08/2012   HCT 36.0 08/08/2012   MCV  85.0 05/26/2012   PLT 289 05/26/2012      Chemistry      Component Value Date/Time   NA 141 08/08/2012 1338   K 3.0* 08/08/2012 1338   CL 103 08/08/2012 1338   CO2 29 05/26/2012 0849   BUN 7 08/08/2012 1338   CREATININE 0.90 08/08/2012 1338      Component Value Date/Time   CALCIUM 9.3 05/26/2012 0849   ALKPHOS 63 05/26/2012 0849   AST 16 05/26/2012 0849   ALT 19 05/26/2012 0849   BILITOT 0.4 05/26/2012 0849       No results found for this basename: LABCA2    No components found with this basename: LABCA125    No results found for this basename: INR:1;PROTIME:1 in the  last 168 hours  Urinalysis No results found for this basename: colorurine,  appearanceur,  labspec,  phurine,  glucoseu,  hgbur,  bilirubinur,  ketonesur,  proteinur,  urobilinogen,  nitrite,  leukocytesur    STUDIES: No results found. A brain MRI 08/08/2012 and bone scan 08/22/2012 showed no evidence of metastatic disease. Mammography is due next week  ASSESSMENT: 61 y.o.  [place] woman   (1) status post right breast biopsy 10/25/2011 for a high-grade ductal carcinoma in situ which was strongly estrogen and progesterone receptor positive  (2) definitive right lumpectomy December of 2012 showing microinvasive disease, the largest of 3 foci measuring 0.26 cm. The invasive disease was estrogen receptor and HER-2 positive, progesterone receptor negative.  (3) She had subsequent sentinel lymph node sampling  December of 2012, with all 4 sentinel lymph nodes negative, for a final stage of T1a N0 or stage IA   (4) radiation therapy completed March 2013  (5) tamoxifen started 04/09/2012, stopped after approximately 6 weeks due to dizziness and multiple other symptoms, which have largely resolved.   PLAN: We went over her situation again in detail, and she understands with a micrometastatic invasive breast cancer her risk of recurrence is so low that anti-estrogens wouldn't only minimally contribute to  risk reduction. A better reason to consider antiestrogen seen her situation is that they cut in half the risk of a new breast cancer developing in either breast. In other words of a prevention strategy in her case makes more sense in the treatment strategy. That is why the NCCN guidelines suggest we "consider endocrine treatment" in women like her.  Certainly tamoxifen is out of the question. She could consider letrozole or another one of the aromatase inhibitors. I have given her written information on letrozole today, but this is not something she wants to try now, when she is just pulling out of the scary situation she found herself in just a few weeks ago. Accordingly she will return to see me in 6 months. She knows to call for any problems that may develop before that visit.    Xai Frerking C    09/18/2012

## 2012-09-19 ENCOUNTER — Telehealth: Payer: Self-pay | Admitting: *Deleted

## 2012-09-19 NOTE — Telephone Encounter (Signed)
Per orders from 09-18-2012 made patient six month appointment with the md with the labs the same day

## 2012-10-21 NOTE — Telephone Encounter (Signed)
Duplicate entry

## 2012-10-27 ENCOUNTER — Ambulatory Visit
Admission: RE | Admit: 2012-10-27 | Discharge: 2012-10-27 | Disposition: A | Discharge status: Home / Self Care | Payer: BC Managed Care – PPO | Source: Ambulatory Visit | Attending: Oncology | Admitting: Oncology

## 2012-10-27 ENCOUNTER — Ambulatory Visit: Payer: BC Managed Care – PPO | Admitting: Physician Assistant

## 2012-10-27 DIAGNOSIS — Z853 Personal history of malignant neoplasm of breast: Secondary | ICD-10-CM

## 2013-02-16 ENCOUNTER — Ambulatory Visit (INDEPENDENT_AMBULATORY_CARE_PROVIDER_SITE_OTHER): Payer: BC Managed Care – PPO | Admitting: General Surgery

## 2013-02-16 ENCOUNTER — Encounter (INDEPENDENT_AMBULATORY_CARE_PROVIDER_SITE_OTHER): Payer: Self-pay | Admitting: General Surgery

## 2013-02-16 VITALS — BP 110/66 | HR 72 | Resp 18 | Ht 62.0 in | Wt 162.0 lb

## 2013-02-16 NOTE — Progress Notes (Signed)
Subjective:     Patient ID: Penny Foster, female   DOB: 1951/03/04, 62 y.o.   MRN: [id number]  HPI 89 yof who presents in follow up after right lumpectomy/sentinel node biopsy for T1N0 er+/pr- tumor. This was followed by radiation therapy and then she was on tamoxifen.  She had a lot of difficulty on tamoxifen and has no stopped antiestrogen therapy.  She is doing much better today and is improved.  She reports no real complaints about her breasts at all today. She underwent mmg in November that will be followed up in one year.  She had a prior seroma that has been drained and this area is still present.  She still has some itching in her feet and hands.    Review of Systems     Objective:   Physical Exam  Constitutional: She appears well-developed and well-nourished.  Pulmonary/Chest: Right breast exhibits mass (consistent with prior seroma). Right breast exhibits no inverted nipple, no nipple discharge, no skin change and no tenderness. Left breast exhibits no inverted nipple, no mass, no nipple discharge, no skin change and no tenderness. Breasts are symmetrical.    Lymphadenopathy:    She has no cervical adenopathy.    She has no axillary adenopathy.       Right: No supraclavicular adenopathy present.       Left: No supraclavicular adenopathy present.       Assessment:     History of breast cancer    Plan:     She has evidence of old seroma.  We discussed options for this but she is happy right now.  She has no clinical evidence of recurrence.  She will continue self exams, clinical exams and mm.  She is going to see Dr Darnelle Catalan again about some further therapy.  I will see her back six months after his next visit and can switch visits.

## 2013-03-20 ENCOUNTER — Other Ambulatory Visit: Payer: Self-pay | Admitting: *Deleted

## 2013-03-20 DIAGNOSIS — C50519 Malignant neoplasm of lower-outer quadrant of unspecified female breast: Secondary | ICD-10-CM

## 2013-03-23 ENCOUNTER — Ambulatory Visit (HOSPITAL_BASED_OUTPATIENT_CLINIC_OR_DEPARTMENT_OTHER): Payer: BC Managed Care – PPO | Admitting: Oncology

## 2013-03-23 ENCOUNTER — Other Ambulatory Visit (HOSPITAL_BASED_OUTPATIENT_CLINIC_OR_DEPARTMENT_OTHER): Payer: BC Managed Care – PPO | Admitting: Lab

## 2013-03-23 ENCOUNTER — Telehealth: Payer: Self-pay | Admitting: *Deleted

## 2013-03-23 VITALS — BP 141/86 | HR 67 | Temp 98.5°F | Resp 20 | Ht 62.0 in | Wt 164.5 lb

## 2013-03-23 DIAGNOSIS — Z170 Estrogen receptor positive status [ER+]: Secondary | ICD-10-CM

## 2013-03-23 DIAGNOSIS — C50419 Malignant neoplasm of upper-outer quadrant of unspecified female breast: Secondary | ICD-10-CM

## 2013-03-23 DIAGNOSIS — D0590 Unspecified type of carcinoma in situ of unspecified breast: Secondary | ICD-10-CM

## 2013-03-23 DIAGNOSIS — C50519 Malignant neoplasm of lower-outer quadrant of unspecified female breast: Secondary | ICD-10-CM

## 2013-03-23 LAB — COMPREHENSIVE METABOLIC PANEL (CC13)
ALT: 12 U/L (ref 0–55)
Alkaline Phosphatase: 72 U/L (ref 40–150)
CO2: 29 mEq/L (ref 22–29)
Sodium: 140 mEq/L (ref 136–145)
Total Bilirubin: 0.41 mg/dL (ref 0.20–1.20)
Total Protein: 7.9 g/dL (ref 6.4–8.3)

## 2013-03-23 LAB — CBC WITH DIFFERENTIAL/PLATELET
BASO%: 1 % (ref 0.0–2.0)
LYMPH%: 34.5 % (ref 14.0–49.7)
MCHC: 33.9 g/dL (ref 31.5–36.0)
MONO#: 0.5 10*3/uL (ref 0.1–0.9)
Platelets: 284 10*3/uL (ref 145–400)
RBC: 4.74 10*6/uL (ref 3.70–5.45)
RDW: 13.9 % (ref 11.2–14.5)
WBC: 6.5 10*3/uL (ref 3.9–10.3)
lymph#: 2.2 10*3/uL (ref 0.9–3.3)

## 2013-03-23 NOTE — Progress Notes (Signed)
ID: [name]   DOB: 08/08/1951  MR#: [id number]  JXB#:[id number]  PCP: Barron Alvine, MD SUEmelia Loron MD GYN: OTHER MD:   HISTORY OF PRESENT ILLNESS: The patient had a screening mammogram at [facility] in October 2012 showing new calcifications in the right breast. Repeat right diagnostic mammography on 10/04/2011 showed a cluster of heterogeneous calcifications spanning 2.4 cm. 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  With this information the patient was referred for bilateral breast MRI, performed 10/31/2011. This showed a 2 cm area of abnormal clumped nodular enhancement in the right breast 9:00 location extending posteriorly from the biopsy clip. There were no other areas of abnormal enhancement and no evidence of lymph node involvement.  The patient proceeded to right lumpectomyDecember 2012, the pathology showing in addition to her DCIS, 3 areas of invasion, the largest measuring 0.26 cm. The invasive disease was strongly estrogen receptor positive, progesterone receptor negative, but HER-2 amplified. Later the same month the patient proceeded to sentinel lymph node sampling, and this showed 4 sentinel lymph nodes to be clear.   INTERVAL HISTORY: Malyssa came today with her husband Ian Bushman for followup of her breast cancer. The interval history is generally unremarkable. She is doing "much better", doing all her cocaine, iron in, church volunteering, and generally "I am back to normal."  REVIEW OF SYSTEMS: She has had no further problems with dizziness. She does describe herself as mildly to moderately fatigued, although that also is getting better. Her hearing in her right ear is a little bit worse than before. She has occasional palpitations. There is no shortness of breath, chest  pain or pressure, or presyncopal symptoms. She does describe herself is anxious, but not depressed. She has occasional hot flashes, but those are also improved. A detailed review of systems today was otherwise noncontributory.  PAST MEDICAL HISTORY: Past Medical History  Diagnosis Date  . Meniere disease   . Complication of anesthesia   . PONV (postoperative nausea and vomiting)   . Breast cancer 10/25/11    R breast bx- DCIS, ER/PR+  . Hypertension   . Hx of radiation therapy 01/08/12 to 02/20/12    R breast    PAST SURGICAL HISTORY: Past Surgical History  Procedure Laterality Date  . Inner ear surgery    . Cholecystectomy    . Appendectomy    . Breast lumpectomy  11/15/2011    Procedure: LUMPECTOMY;  Surgeon: [name], MD;  Location: [facility];  Service: General;  Laterality: Right;  right breast wire guided lumpectomy , bracketed  . Breast surgery      right axillary sentinel node biopsy    FAMILY HISTORY Family History  Problem Relation Age of Onset  . Cancer Sister     stomach  . Cancer Brother     lung  The patient's father died at the age of 66; her mother died at the age of 37. She has 4 brothers and 5 sisters. One sister had stomach cancer, one brother had lung cancer. There is no history of breast or ovarian cancer in the family.   GYNECOLOGIC HISTORY: Menarche age 69, first pregnancy to term age 29. She is GX P2. She does not recall how long ago she stopped having periods. She never used hormone replacement or birth control pills.  SOCIAL HISTORY: She  worked as Research officer, political party. Her husband Ellyana Crigler is a retired Art gallery manager although he rose to be a Warehouse manager of the company. Son Jaie [place] lives in [place] where he works in [employer]. Daughter Maria [place] is Interior and spatial designer of the [facility] group home for disabled adults program. The patient has one grandchild. She attends the [employer],    ADVANCED  DIRECTIVES:  HEALTH MAINTENANCE: History  Substance Use Topics  . Smoking status: Never Smoker   . Smokeless tobacco: Never Used  . Alcohol Use: No     Colonoscopy:  PAP: 2012  Bone density: 2007  Lipid panel:   Allergies  Allergen Reactions  . Penicillins Nausea And Vomiting    Current Outpatient Prescriptions  Medication Sig Dispense Refill  . cetirizine (ZYRTEC) 10 MG chewable tablet Chew 10 mg by mouth daily.      Marland Kitchen diltiazem (DILACOR XR) 180 MG 24 hr capsule       . fluticasone (FLOVENT DISKUS) 50 MCG/BLIST diskus inhaler Inhale 1 puff into the lungs 2 (two) times daily.      . hydrochlorothiazide (HYDRODIURIL) 25 MG tablet       . ibuprofen (ADVIL,MOTRIN) 200 MG tablet Take 200 mg by mouth every 6 (six) hours as needed. 2 po prn low back pain      . KLOR-CON M20 20 MEQ tablet Take 20 mEq by mouth 2 (two) times daily.       Marland Kitchen LORazepam (ATIVAN) 1 MG tablet       . ondansetron (ZOFRAN-ODT) 4 MG disintegrating tablet        No current facility-administered medications for this visit.    OBJECTIVE: Middle-aged [place] American woman  who appears well Filed Vitals:   03/23/13 1055  BP: 141/86  Pulse: 67  Temp: 98.5 F (36.9 C)  Resp: 20     Body mass index is 30.08 kg/(m^2).    ECOG FS: 0  Sclerae unicteric Oropharynx clear No peripheral adenopathy Lungs no rales or rhonchi, good excursion bilaterally Heart regular rate and rhythm, no murmur appreciated Abd soft, nontender, positive bowel sounds, no organomegaly MSK no focal spinal tenderness, no peripheral edema Neuro: Nonfocal, well oriented, friendly affect Breasts: The right breast is slightly hyperpigmented over the radiation port area.. The area of firmness in the under side of the breast is stable and consistent with scar tissue.  The right axillary area is clear; the left breast is unremarkable  LAB RESULTS: Lab Results  Component Value Date   WBC 6.5 03/23/2013   NEUTROABS 3.5 03/23/2013   HGB 13.9  03/23/2013   HCT 40.9 03/23/2013   MCV 86.4 03/23/2013   PLT 284 03/23/2013      Chemistry      Component Value Date/Time   NA 140 03/23/2013 1024   NA 141 08/08/2012 1338   K 3.7 03/23/2013 1024   K 3.0* 08/08/2012 1338   CL 101 03/23/2013 1024   CL 103 08/08/2012 1338   CO2 29 03/23/2013 1024   CO2 29 05/26/2012 0849   BUN 12.0 03/23/2013 1024   BUN 7 08/08/2012 1338   CREATININE 0.9 03/23/2013 1024   CREATININE 0.90 08/08/2012 1338      Component Value Date/Time   CALCIUM 9.3 03/23/2013 1024   CALCIUM 9.3 05/26/2012 0849   ALKPHOS 72 03/23/2013 1024   ALKPHOS 63 05/26/2012 0849   AST 16 03/23/2013 1024   AST 16 05/26/2012 0849   ALT 12 03/23/2013 1024  ALT 19 05/26/2012 0849   BILITOT 0.41 03/23/2013 1024   BILITOT 0.4 05/26/2012 0849       No results found for this basename: LABCA2    No components found with this basename: LABCA125    No results found for this basename: INR,  in the last 168 hours  Urinalysis No results found for this basename: colorurine,  appearanceur,  labspec,  phurine,  glucoseu,  hgbur,  bilirubinur,  ketonesur,  proteinur,  urobilinogen,  nitrite,  leukocytesur    STUDIES: No results found.   ASSESSMENT: 62 y.o.  [place] woman   (1) status post right breast biopsy 10/25/2011 for a high-grade ductal carcinoma in situ which was strongly estrogen and progesterone receptor positive  (2) definitive right lumpectomy December of 2012 showing microinvasive disease, the largest of 3 foci measuring 0.26 cm. The invasive disease was estrogen receptor and HER-2 positive, progesterone receptor negative.  (3) She had subsequent sentinel lymph node sampling  December of 2012, with all 4 sentinel lymph nodes negative, for a final stage of T1a N0 or stage IA   (4) radiation therapy completed March 2013  (5) tamoxifen started 04/09/2012, stopped after approximately 6 weeks due to dizziness and multiple other symptoms, which eventually  resolved.   PLAN: We again discussed her prognosis, which is excellent. She understands that NCCN guidelines recommend patients like her "consider it" antiestrogen therapy, and the reason for the weakness of this recommendation is the fact that even without any systemic treatment her risk of recurrence is so low.  Mother is very clear that she does not desire to try anti-estrogens again. Accordingly we are proceeding with observation alone. She will have her next mammogram in the fall and see me again February of 2015. She knows to call for any problems that may develop before that visit   MAGRINAT,GUSTAV C    03/23/2013

## 2013-03-23 NOTE — Telephone Encounter (Signed)
Pt did not want her labs a weel apart fronm her ov due to she live out of town. gv appt d/t for 01/26/14...td

## 2013-08-31 ENCOUNTER — Other Ambulatory Visit: Payer: Self-pay | Admitting: Oncology

## 2013-08-31 DIAGNOSIS — Z853 Personal history of malignant neoplasm of breast: Secondary | ICD-10-CM

## 2013-10-27 ENCOUNTER — Other Ambulatory Visit: Payer: Self-pay | Admitting: Oncology

## 2013-10-27 ENCOUNTER — Other Ambulatory Visit: Payer: Self-pay

## 2013-10-27 DIAGNOSIS — Z853 Personal history of malignant neoplasm of breast: Secondary | ICD-10-CM

## 2013-10-28 ENCOUNTER — Ambulatory Visit
Admission: RE | Admit: 2013-10-28 | Discharge: 2013-10-28 | Disposition: A | Discharge status: Home / Self Care | Payer: BC Managed Care – PPO | Source: Ambulatory Visit | Attending: Oncology | Admitting: Oncology

## 2013-10-28 DIAGNOSIS — Z853 Personal history of malignant neoplasm of breast: Secondary | ICD-10-CM

## 2013-12-04 ENCOUNTER — Telehealth: Payer: Self-pay | Admitting: *Deleted

## 2013-12-04 NOTE — Telephone Encounter (Signed)
sw pt informed the pt that GCM will be on pal 01/26/14. gv appt for 01/26/14 w/labs @ 9:15 adn ov@ 9:45am. Pt is aware that i will mail a letter/avs...td

## 2014-01-04 ENCOUNTER — Other Ambulatory Visit: Payer: Self-pay | Admitting: Physician Assistant

## 2014-01-19 ENCOUNTER — Other Ambulatory Visit: Payer: Self-pay | Admitting: Lab

## 2014-01-25 ENCOUNTER — Telehealth: Payer: Self-pay | Admitting: Physician Assistant

## 2014-01-25 NOTE — Telephone Encounter (Signed)
, °

## 2014-01-26 ENCOUNTER — Ambulatory Visit: Payer: Self-pay | Admitting: Oncology

## 2014-01-26 ENCOUNTER — Other Ambulatory Visit: Payer: Self-pay

## 2014-01-26 ENCOUNTER — Ambulatory Visit: Payer: Self-pay | Admitting: Physician Assistant

## 2014-02-03 ENCOUNTER — Telehealth: Payer: Self-pay | Admitting: *Deleted

## 2014-02-03 ENCOUNTER — Other Ambulatory Visit: Payer: Self-pay | Admitting: Physician Assistant

## 2014-02-03 NOTE — Progress Notes (Signed)
I have spoken to Penny Foster by phone today. She has quite a distance to travel for her appointment, and would like to cancel her appointment on 02/04/2014 22 increments weather. She is being rescheduled for March 18, with labs at 9:15 and an appointment with me at 9:45.  A POF has been generated to make these changes. The patient is aware of the date and time of this followup, and voices her understanding and agreement.  Micah Flesher, PA-C 02/03/2014

## 2014-02-03 NOTE — Telephone Encounter (Signed)
Per pof.per orders [facility] pt is aware of her appts moved from 02/04/14 to 02/24/14...td

## 2014-02-04 ENCOUNTER — Ambulatory Visit: Payer: Self-pay | Admitting: Physician Assistant

## 2014-02-04 ENCOUNTER — Other Ambulatory Visit: Payer: Self-pay

## 2014-02-24 ENCOUNTER — Telehealth: Payer: Self-pay | Admitting: *Deleted

## 2014-02-24 ENCOUNTER — Other Ambulatory Visit (HOSPITAL_BASED_OUTPATIENT_CLINIC_OR_DEPARTMENT_OTHER): Payer: BC Managed Care – PPO

## 2014-02-24 ENCOUNTER — Ambulatory Visit (HOSPITAL_BASED_OUTPATIENT_CLINIC_OR_DEPARTMENT_OTHER): Payer: BC Managed Care – PPO | Admitting: Physician Assistant

## 2014-02-24 ENCOUNTER — Encounter: Payer: Self-pay | Admitting: Physician Assistant

## 2014-02-24 VITALS — BP 157/92 | HR 68 | Temp 98.4°F | Resp 18 | Ht 62.0 in | Wt 178.1 lb

## 2014-02-24 DIAGNOSIS — Z853 Personal history of malignant neoplasm of breast: Secondary | ICD-10-CM

## 2014-02-24 DIAGNOSIS — C50419 Malignant neoplasm of upper-outer quadrant of unspecified female breast: Secondary | ICD-10-CM

## 2014-02-24 DIAGNOSIS — C50919 Malignant neoplasm of unspecified site of unspecified female breast: Secondary | ICD-10-CM

## 2014-02-24 DIAGNOSIS — Z171 Estrogen receptor negative status [ER-]: Secondary | ICD-10-CM

## 2014-02-24 DIAGNOSIS — I10 Essential (primary) hypertension: Secondary | ICD-10-CM

## 2014-02-24 LAB — CBC WITH DIFFERENTIAL/PLATELET
BASO%: 0.9 % (ref 0.0–2.0)
Basophils Absolute: 0.1 10*3/uL (ref 0.0–0.1)
EOS ABS: 0.1 10*3/uL (ref 0.0–0.5)
EOS%: 1.6 % (ref 0.0–7.0)
HCT: 42.2 % (ref 34.8–46.6)
HGB: 14.2 g/dL (ref 11.6–15.9)
LYMPH%: 28.4 % (ref 14.0–49.7)
MCH: 29.1 pg (ref 25.1–34.0)
MCHC: 33.6 g/dL (ref 31.5–36.0)
MCV: 86.4 fL (ref 79.5–101.0)
MONO#: 0.8 10*3/uL (ref 0.1–0.9)
MONO%: 8.4 % (ref 0.0–14.0)
NEUT%: 60.7 % (ref 38.4–76.8)
NEUTROS ABS: 5.6 10*3/uL (ref 1.5–6.5)
Platelets: 302 10*3/uL (ref 145–400)
RBC: 4.89 10*6/uL (ref 3.70–5.45)
RDW: 13.8 % (ref 11.2–14.5)
WBC: 9.2 10*3/uL (ref 3.9–10.3)
lymph#: 2.6 10*3/uL (ref 0.9–3.3)

## 2014-02-24 LAB — COMPREHENSIVE METABOLIC PANEL (CC13)
ALK PHOS: 76 U/L (ref 40–150)
ALT: 15 U/L (ref 0–55)
AST: 16 U/L (ref 5–34)
Albumin: 3.8 g/dL (ref 3.5–5.0)
Anion Gap: 12 mEq/L — ABNORMAL HIGH (ref 3–11)
BUN: 11.6 mg/dL (ref 7.0–26.0)
CO2: 27 meq/L (ref 22–29)
Calcium: 9.8 mg/dL (ref 8.4–10.4)
Chloride: 105 mEq/L (ref 98–109)
Creatinine: 0.8 mg/dL (ref 0.6–1.1)
GLUCOSE: 100 mg/dL (ref 70–140)
Potassium: 3.6 mEq/L (ref 3.5–5.1)
SODIUM: 143 meq/L (ref 136–145)
TOTAL PROTEIN: 8.1 g/dL (ref 6.4–8.3)
Total Bilirubin: 0.46 mg/dL (ref 0.20–1.20)

## 2014-02-24 NOTE — Telephone Encounter (Signed)
appts made and printed...td 

## 2014-02-24 NOTE — Progress Notes (Signed)
ID: Penny Foster   DOB: 1951/07/16  MR#: [id number]  WER#:[id number]  PCP: Penny Every, MD SU: Penny Bookbinder MD GYN: OTHER MD: Penny Rudd, MD  CHIEF COMPLAINT:  Hx of Right Breast Cancer   HISTORY OF PRESENT ILLNESS: The patient had a screening mammogram at [facility] in October 2012 showing new calcifications in the right breast. Repeat right diagnostic mammography on 10/04/2011 showed a cluster of heterogeneous calcifications spanning 2.4 cm. 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  With this information the patient was referred for bilateral breast MRI, performed 10/31/2011. This showed a 2 cm area of abnormal clumped nodular enhancement in the right breast 9:00 location extending posteriorly from the biopsy clip. There were no other areas of abnormal enhancement and no evidence of lymph node involvement.  The patient proceeded to right lumpectomyDecember 2012, the pathology showing in addition to her DCIS, 3 areas of invasion, the largest measuring 0.26 cm. The invasive disease was strongly estrogen receptor positive, progesterone receptor negative, but HER-2 amplified. Later the same month the patient proceeded to sentinel lymph node sampling, and this showed 4 sentinel lymph nodes to be clear.   Subsequent treatment history is as detailed below.  INTERVAL HISTORY: Penny Foster returned alone today for routine followup of her right breast cancer. The interval history is generally unremarkable. She stays very busy, doing her own housework, volunteering at church, and tries to walk at least 30 minutes daily.  She complains only some mild fatigue, but overall feels like she is "doing very well".   REVIEW OF SYSTEMS: Penny Foster denies any illnesses and has had no recent fevers, chills, or night  sweats. She denies any rashes or skin changes. She's had no abnormal bruising or bleeding. Her appetite is good, and she denies any nausea or change in bowel or bladder habits. She's had no increased cough, phlegm production, shortness of breath, peripheral swelling, chest pain, or palpitations. She's had no additional dizziness since discontinuing the tamoxifen. She also denies any abnormal headaches and has had no change in vision. She has noted some slight hearing loss in her right ear. She has no unusual myalgias, arthralgias, or bony pain. She does have some mild anxiety at times which she considers to be stable. She denies depression and denies suicidal ideation.  A detailed review of systems is otherwise stable and noncontributory.   PAST MEDICAL HISTORY: Past Medical History  Diagnosis Date  . Meniere disease   . Complication of anesthesia   . PONV (postoperative nausea and vomiting)   . Breast cancer 10/25/11    R breast bx- DCIS, ER/PR+  . Hypertension   . Hx of radiation therapy 01/08/12 to 02/20/12    R breast    PAST SURGICAL HISTORY: Past Surgical History  Procedure Laterality Date  . Inner ear surgery    . Cholecystectomy    . Appendectomy    . Breast lumpectomy  11/15/2011    Procedure: LUMPECTOMY;  Surgeon: Penny Bookbinder, MD;  Location: [place];  Service: General;  Laterality: Right;  right breast wire guided lumpectomy , bracketed  . Breast surgery      right axillary sentinel node biopsy    FAMILY HISTORY Family History  Problem Relation Age of Onset  . Cancer Sister     stomach  . Cancer Brother  lung  The patient's father died at the age of 107; her mother died at the age of 38. She has 4 brothers and 5 sisters. One sister had stomach cancer, one brother had lung cancer. There is no history of breast or ovarian cancer in the family.   GYNECOLOGIC HISTORY: Menarche age 76, first pregnancy to term age 89. She is GX P2. She does not  recall how long ago she stopped having periods. She never used hormone replacement or birth control pills.  SOCIAL HISTORY:  (Updated 02/24/2014) She worked as Probation officer, now retired. Her husband Penny Foster is a retired Chief Financial Officer although he rose to be a Engineer, maintenance of the company. Son Penny Foster lives in [place] where he works in [employer]. Daughter Penny Foster is Mudlogger of the [facility] group home for disabled adults program. The patient has one grandchild. She attends the [employer],    ADVANCED DIRECTIVES:  HEALTH MAINTENANCE:  (Updated 02/24/2014) History  Substance Use Topics  . Smoking status: Never Smoker   . Smokeless tobacco: Never Used  . Alcohol Use: No     Colonoscopy:  Not on file  PAP: Not on file  Bone density: Not on file (Approx 2007)  Lipid panel:  Not on file/[facility]   Allergies  Allergen Reactions  . Penicillins Nausea And Vomiting    Current Outpatient Prescriptions  Medication Sig Dispense Refill  . desloratadine (CLARINEX) 5 MG tablet       . diltiazem (CARDIZEM CD) 180 MG 24 hr capsule       . fluticasone (FLONASE) 50 MCG/ACT nasal spray       . hydrochlorothiazide (HYDRODIURIL) 25 MG tablet       . KLOR-CON M20 20 MEQ tablet Take 20 mEq by mouth 2 (two) times daily.       Marland Kitchen LORazepam (ATIVAN) 1 MG tablet       . fluticasone (FLOVENT DISKUS) 50 MCG/BLIST diskus inhaler Inhale 1 puff into the lungs 2 (two) times daily.      Marland Kitchen ibuprofen (ADVIL,MOTRIN) 200 MG tablet Take 200 mg by mouth Foster 6 (six) hours as needed. 2 po prn low back pain       No current facility-administered medications for this visit.    OBJECTIVE: Middle-aged [place] American woman  who appears well and is in no acute distress Filed Vitals:   02/24/14 0939  BP: 157/92  Pulse: 68  Temp: 98.4 F (36.9 C)  Resp: 18     Body mass index is 32.57 kg/(m^2).    ECOG FS: 0 Filed Weights   02/24/14 0939  Weight: 178 lb 1.6 oz  (80.786 kg)   Physical Exam: HEENT:  Sclerae anicteric.  Oropharynx clear, pink, and moist. Neck supple, trachea midline. No thyromegaly.  NODES:  No cervical or supraclavicular lymphadenopathy palpated.  BREAST EXAM: Right breast is status post lumpectomy. Palpable scar tissue beneath the incision line, but otherwise no suspicious nodularity, skin changes, and no evidence of local recurrence. Left breast is unremarkable. Axillae are benign bilaterally, with no palpable lymphadenopathy. LUNGS:  Clear to auscultation bilaterally with good excursion.  No wheezes or rhonchi HEART:  Regular rate and rhythm. No murmur appreciated ABDOMEN:  Soft, nontender. No hepatomegaly. No masses palpated. Positive bowel sounds.  MSK:  No focal spinal tenderness to palpation. Good range of motion bilaterally in both the upper and lower extremities. EXTREMITIES:  No peripheral edema.  No lymphedema in the right upper extremity. SKIN:  Benign with no visible rashes or skin lesions. No excessive ecchymoses. No petechiae.  NEURO:  Nonfocal. Well oriented.  Positive affect.   LAB RESULTS: Lab Results  Component Value Date   WBC 9.2 02/24/2014   NEUTROABS 5.6 02/24/2014   HGB 14.2 02/24/2014   HCT 42.2 02/24/2014   MCV 86.4 02/24/2014   PLT 302 02/24/2014      Chemistry      Component Value Date/Time   NA 143 02/24/2014 0838   NA 141 08/08/2012 1338   K 3.6 02/24/2014 0838   K 3.0* 08/08/2012 1338   CL 101 03/23/2013 1024   CL 103 08/08/2012 1338   CO2 27 02/24/2014 0838   CO2 29 05/26/2012 0849   BUN 11.6 02/24/2014 0838   BUN 7 08/08/2012 1338   CREATININE 0.8 02/24/2014 0838   CREATININE 0.90 08/08/2012 1338      Component Value Date/Time   CALCIUM 9.8 02/24/2014 0838   CALCIUM 9.3 05/26/2012 0849   ALKPHOS 76 02/24/2014 0838   ALKPHOS 63 05/26/2012 0849   AST 16 02/24/2014 0838   AST 16 05/26/2012 0849   ALT 15 02/24/2014 0838   ALT 19 05/26/2012 0849   BILITOT 0.46 02/24/2014 0838   BILITOT 0.4 05/26/2012 0849        STUDIES:  Most recent bilateral mammogram on 10/28/2013 was unremarkable.   ASSESSMENT: 63 y.o.  [place] woman   (1) status post right breast biopsy 10/25/2011 for a high-grade ductal carcinoma in situ which was strongly estrogen and progesterone receptor positive  (2) definitive right lumpectomy December of 2012 showing microinvasive disease, the largest of 3 foci measuring 0.26 cm. The invasive disease was estrogen receptor and HER-2 positive, progesterone receptor negative.  (3) She had subsequent sentinel lymph node sampling  December of 2012, with all 4 sentinel lymph nodes negative, for a final stage of T1a N0 or stage IA   (4) radiation therapy completed March 2013  (5) tamoxifen started 04/09/2012, stopped after approximately 6 weeks due to dizziness and multiple other symptoms, which eventually resolved.  Followed with observation alone since that time.   PLAN:  With regards to her breast cancer, Amal appears to be doing well, and there is no clinical evidence of disease recurrence at this time. We will continue to see her on an annual basis for labs and physical exam in March of beach year, and she will have her annual mammograms in November of each year. Both of these are being scheduled for her accordingly.  Zeba voices her understanding and agreement with the above plan. Of course she knows to call any time should there be any changes or problems prior to her next annual visit here in March 2016.    Jenavee Laguardia PA-C    02/24/2014

## 2014-04-06 ENCOUNTER — Encounter (INDEPENDENT_AMBULATORY_CARE_PROVIDER_SITE_OTHER): Payer: Self-pay | Admitting: General Surgery

## 2014-04-06 ENCOUNTER — Ambulatory Visit (INDEPENDENT_AMBULATORY_CARE_PROVIDER_SITE_OTHER): Payer: BC Managed Care – PPO | Admitting: General Surgery

## 2014-04-06 VITALS — BP 126/78 | HR 75 | Temp 97.6°F | Ht 62.0 in | Wt 177.8 lb

## 2014-04-06 DIAGNOSIS — C50519 Malignant neoplasm of lower-outer quadrant of unspecified female breast: Secondary | ICD-10-CM

## 2014-04-06 NOTE — Progress Notes (Signed)
Subjective:     Patient ID: Penny Foster, female   DOB: October 20, 1951, 63 y.o.   MRN: [id number]  HPI This is a 63 year old female who underwent right lumpectomy/snbx for T1N0 tumor that was er positive.  She tried antiestrogen but stopped due to significant side effects and does not ever want to try again.  She completed radiation therapy. She had a seroma but states this has resolved.  She had mm in 11/14 that was negative and is due again in one year.  She has no complaints related to either breast today and is overall doing well. She has no right arm symptoms.   Review of Systems     Objective:   Physical Exam  Constitutional: She appears well-developed and well-nourished.  Pulmonary/Chest: Right breast exhibits skin change (c/w radiotherapy). Right breast exhibits no inverted nipple, no mass, no nipple discharge and no tenderness. Left breast exhibits no inverted nipple, no mass, no nipple discharge, no skin change and no tenderness.    Lymphadenopathy:    She has no cervical adenopathy.    She has no axillary adenopathy.       Right: No supraclavicular adenopathy present.       Left: No supraclavicular adenopathy present.       Assessment:     Stage I right breast cancer    Plan:     She has no clinical evidence of recurrence.  She will get her mm in November and continue her own exams.  I will see her back in 6 months for exam and then she sees cancer center in one year.  Then we can begin alternating visits.

## 2014-10-11 ENCOUNTER — Encounter (INDEPENDENT_AMBULATORY_CARE_PROVIDER_SITE_OTHER): Payer: Self-pay | Admitting: General Surgery

## 2014-11-01 ENCOUNTER — Ambulatory Visit
Admission: RE | Admit: 2014-11-01 | Discharge: 2014-11-01 | Disposition: A | Discharge status: Home / Self Care | Payer: BC Managed Care – PPO | Source: Ambulatory Visit | Attending: Physician Assistant | Admitting: Physician Assistant

## 2014-11-01 DIAGNOSIS — Z853 Personal history of malignant neoplasm of breast: Secondary | ICD-10-CM

## 2015-02-25 ENCOUNTER — Ambulatory Visit (HOSPITAL_BASED_OUTPATIENT_CLINIC_OR_DEPARTMENT_OTHER): Payer: 59 | Admitting: Nurse Practitioner

## 2015-02-25 ENCOUNTER — Encounter: Payer: Self-pay | Admitting: Nurse Practitioner

## 2015-02-25 ENCOUNTER — Other Ambulatory Visit (HOSPITAL_BASED_OUTPATIENT_CLINIC_OR_DEPARTMENT_OTHER): Payer: 59

## 2015-02-25 ENCOUNTER — Other Ambulatory Visit: Payer: Self-pay | Admitting: *Deleted

## 2015-02-25 ENCOUNTER — Telehealth: Payer: Self-pay | Admitting: Nurse Practitioner

## 2015-02-25 VITALS — BP 139/77 | HR 68 | Temp 97.8°F | Resp 18 | Ht 62.0 in | Wt 180.2 lb

## 2015-02-25 DIAGNOSIS — Z853 Personal history of malignant neoplasm of breast: Secondary | ICD-10-CM

## 2015-02-25 DIAGNOSIS — C50519 Malignant neoplasm of lower-outer quadrant of unspecified female breast: Secondary | ICD-10-CM

## 2015-02-25 DIAGNOSIS — C50511 Malignant neoplasm of lower-outer quadrant of right female breast: Secondary | ICD-10-CM

## 2015-02-25 LAB — COMPREHENSIVE METABOLIC PANEL (CC13)
ALT: 19 U/L (ref 0–55)
ANION GAP: 10 meq/L (ref 3–11)
AST: 17 U/L (ref 5–34)
Albumin: 3.5 g/dL (ref 3.5–5.0)
Alkaline Phosphatase: 83 U/L (ref 40–150)
BUN: 10.7 mg/dL (ref 7.0–26.0)
CO2: 26 meq/L (ref 22–29)
CREATININE: 0.8 mg/dL (ref 0.6–1.1)
Calcium: 9.2 mg/dL (ref 8.4–10.4)
Chloride: 104 mEq/L (ref 98–109)
GLUCOSE: 90 mg/dL (ref 70–140)
Potassium: 4 mEq/L (ref 3.5–5.1)
Sodium: 140 mEq/L (ref 136–145)
Total Bilirubin: 0.3 mg/dL (ref 0.20–1.20)
Total Protein: 7.9 g/dL (ref 6.4–8.3)

## 2015-02-25 LAB — CBC WITH DIFFERENTIAL/PLATELET
BASO%: 1 % (ref 0.0–2.0)
BASOS ABS: 0.1 10*3/uL (ref 0.0–0.1)
EOS%: 2.4 % (ref 0.0–7.0)
Eosinophils Absolute: 0.2 10*3/uL (ref 0.0–0.5)
HCT: 41.8 % (ref 34.8–46.6)
HEMOGLOBIN: 13.7 g/dL (ref 11.6–15.9)
LYMPH%: 31.2 % (ref 14.0–49.7)
MCH: 28.5 pg (ref 25.1–34.0)
MCHC: 32.7 g/dL (ref 31.5–36.0)
MCV: 87.2 fL (ref 79.5–101.0)
MONO#: 0.5 10*3/uL (ref 0.1–0.9)
MONO%: 7 % (ref 0.0–14.0)
NEUT%: 58.4 % (ref 38.4–76.8)
NEUTROS ABS: 4.3 10*3/uL (ref 1.5–6.5)
PLATELETS: 339 10*3/uL (ref 145–400)
RBC: 4.79 10*6/uL (ref 3.70–5.45)
RDW: 13.9 % (ref 11.2–14.5)
WBC: 7.3 10*3/uL (ref 3.9–10.3)
lymph#: 2.3 10*3/uL (ref 0.9–3.3)

## 2015-02-25 NOTE — Addendum Note (Signed)
Addended by: Marcelino Duster on: 02/25/2015 10:33 AM   Modules accepted: Orders

## 2015-02-25 NOTE — Addendum Note (Signed)
Addended by: Marcelino Duster on: 02/25/2015 10:24 AM   Modules accepted: Level of Service

## 2015-02-25 NOTE — Progress Notes (Signed)
ID: Penny Foster   DOB: 04/03/1951  MR#: [id number]  OHY#:[id number]  PCP: Jene Every, MD SU: Rolm Bookbinder MD GYN: OTHER MD: Kyung Rudd, MD  CHIEF COMPLAINT:  Hx of Right Breast Cancer CURRENT TREATMENT: observation  BREAST CANCER HISTORY: The patient had a screening mammogram at [facility] in October 2012 showing new calcifications in the right breast. Repeat right diagnostic mammography on 10/04/2011 showed a cluster of heterogeneous calcifications spanning 2.4 cm. 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  With this information the patient was referred for bilateral breast MRI, performed 10/31/2011. This showed a 2 cm area of abnormal clumped nodular enhancement in the right breast 9:00 location extending posteriorly from the biopsy clip. There were no other areas of abnormal enhancement and no evidence of lymph node involvement.  The patient proceeded to right lumpectomyDecember 2012, the pathology showing in addition to her DCIS, 3 areas of invasion, the largest measuring 0.26 cm. The invasive disease was strongly estrogen receptor positive, progesterone receptor negative, but HER-2 amplified. Later the same month the patient proceeded to sentinel lymph node sampling, and this showed 4 sentinel lymph nodes to be clear.   Subsequent treatment history is as detailed below.  INTERVAL HISTORY: Merie returns today for follow up of her right breast cancer. The interval history is completely unremarkable. She continues to exercises regularly and watch her weight. She is up 6am every morning to head to the gym to walk on the treadmill. She enjoys it and never feels like she has to force herself to be active, it come naturally.   REVIEW OF SYSTEMS: Kerby denies fevers, chills, nausea,  vomiting, or changes in bowel or bladder habits. She is eating and drinking well. Her energy level is good during the day, and she sleeps well at night. She denies shortness of breath, chest pain, cough, or palpitations. She has no headaches, dizziness, night sweats, or unexplained weight loss. A detailed review of systems is otherwise stable.   PAST MEDICAL HISTORY: Past Medical History  Diagnosis Date  . Meniere disease   . Complication of anesthesia   . PONV (postoperative nausea and vomiting)   . Breast cancer 10/25/11    R breast bx- DCIS, ER/PR+  . Hypertension   . Hx of radiation therapy 01/08/12 to 02/20/12    R breast    PAST SURGICAL HISTORY: Past Surgical History  Procedure Laterality Date  . Inner ear surgery    . Cholecystectomy    . Appendectomy    . Breast lumpectomy  11/15/2011    Procedure: LUMPECTOMY;  Surgeon: Rolm Bookbinder, MD;  Location: [facility];  Service: General;  Laterality: Right;  right breast wire guided lumpectomy , bracketed  . Breast surgery      right axillary sentinel node biopsy    FAMILY HISTORY Family History  Problem Relation Age of Onset  . Cancer Sister     stomach  . Cancer Brother     lung  The patient's father died at the age of 73; her mother died at the age of 18. She has 4 brothers and 5 sisters. One sister had stomach cancer, one brother had lung cancer. There is no history of breast or ovarian cancer in the family.   GYNECOLOGIC HISTORY: Menarche age 27, first pregnancy to term age 21. She is GX  P2. She does not recall how long ago she stopped having periods. She never used hormone replacement or birth control pills.  SOCIAL HISTORY:  (Updated 02/24/2014) She worked as Probation officer, now retired. Her husband Kryssa Risenhoover is a retired Chief Financial Officer although he rose to be a Engineer, maintenance of the company. Son [name] lives in [place] where he works in [employer]. Daughter [name]  is Mudlogger of the [facility] group home for disabled adults program. The patient has one grandchild. She attends the [employer],    ADVANCED DIRECTIVES:  HEALTH MAINTENANCE:  (Updated 02/24/2014) History  Substance Use Topics  . Smoking status: Never Smoker   . Smokeless tobacco: Never Used  . Alcohol Use: No     Colonoscopy:  Not on file  PAP: Not on file  Bone density: Not on file (Approx 2007)  Lipid panel:  Not on file/[facility]   Allergies  Allergen Reactions  . Penicillins Nausea And Vomiting    Current Outpatient Prescriptions  Medication Sig Dispense Refill  . albuterol (PROVENTIL HFA;VENTOLIN HFA) 108 (90 BASE) MCG/ACT inhaler Inhale 2 puffs into the lungs daily.     Marland Kitchen desloratadine (CLARINEX) 5 MG tablet Take 5 mg by mouth daily.     Marland Kitchen diltiazem (CARDIZEM CD) 180 MG 24 hr capsule Take 180 mg by mouth daily.     . hydrochlorothiazide (HYDRODIURIL) 25 MG tablet Take 25 mg by mouth daily.     Marland Kitchen KLOR-CON M20 20 MEQ tablet Take 20 mEq by mouth daily. Pt takes (2) 20 mEq tablets daily. Total of 40 mEq a day.    Marland Kitchen LORazepam (ATIVAN) 1 MG tablet Take 1 mg by mouth daily.     . fluticasone (FLONASE) 50 MCG/ACT nasal spray     . ibuprofen (ADVIL,MOTRIN) 200 MG tablet Take 200 mg by mouth every 6 (six) hours as needed. 2 po prn low back pain     No current facility-administered medications for this visit.    OBJECTIVE: Middle-aged [place] American woman  who appears well and is in no acute distress Filed Vitals:   02/25/15 0901  BP: 139/77  Pulse: 68  Temp: 97.8 F (36.6 C)  Resp: 18     Body mass index is 32.95 kg/(m^2).    ECOG FS: 0 Filed Weights   02/25/15 0901  Weight: 180 lb 3.2 oz (81.738 kg)   Physical Exam:  Skin: warm, dry  HEENT: sclerae anicteric, conjunctivae pink, oropharynx clear. No thrush or mucositis.  Lymph Nodes: No cervical or supraclavicular lymphadenopathy  Lungs: clear to auscultation bilaterally, no rales, wheezes, or rhonci   Heart: regular rate and rhythm  Abdomen: round, soft, non tender, positive bowel sounds  Musculoskeletal: No focal spinal tenderness, no peripheral edema  Neuro: non focal, well oriented, positive affect  Breast: right breast status post lumpectomy and radiation. No evidence of recurrent disease. Right axilla benign. Left breast unremarkable.   LAB RESULTS: Lab Results  Component Value Date   WBC 7.3 02/25/2015   NEUTROABS 4.3 02/25/2015   HGB 13.7 02/25/2015   HCT 41.8 02/25/2015   MCV 87.2 02/25/2015   PLT 339 02/25/2015      Chemistry      Component Value Date/Time   NA 143 02/24/2014 0838   NA 141 08/08/2012 1338   K 3.6 02/24/2014 0838   K 3.0* 08/08/2012 1338   CL 101 03/23/2013 1024   CL 103 08/08/2012 1338   CO2 27 02/24/2014 2703  CO2 29 05/26/2012 0849   BUN 11.6 02/24/2014 0838   BUN 7 08/08/2012 1338   CREATININE 0.8 02/24/2014 0838   CREATININE 0.90 08/08/2012 1338      Component Value Date/Time   CALCIUM 9.8 02/24/2014 0838   CALCIUM 9.3 05/26/2012 0849   ALKPHOS 76 02/24/2014 0838   ALKPHOS 63 05/26/2012 0849   AST 16 02/24/2014 0838   AST 16 05/26/2012 0849   ALT 15 02/24/2014 0838   ALT 19 05/26/2012 0849   BILITOT 0.46 02/24/2014 0838   BILITOT 0.4 05/26/2012 0849       STUDIES:  Most recent bilateral mammogram on 11/01/2014 was unremarkable.   ASSESSMENT: 64 y.o.  [place], [place] woman   (1) status post right breast biopsy 10/25/2011 for a high-grade ductal carcinoma in situ which was strongly estrogen and progesterone receptor positive  (2) definitive right lumpectomy December of 2012 showing microinvasive disease, the largest of 3 foci measuring 0.26 cm. The invasive disease was estrogen receptor and HER-2 positive, progesterone receptor negative.  (3) She had subsequent sentinel lymph node sampling  December of 2012, with all 4 sentinel lymph nodes negative, for a final stage of T1a N0 or stage IA   (4) radiation therapy  completed March 2013  (5) tamoxifen started 04/09/2012, stopped after approximately 6 weeks due to dizziness and multiple other symptoms, which eventually resolved.  Followed with observation alone since that time.   PLAN:  Louine continues to do well as far as her breast cancer is concerned. She is now 3 years out from her definitive surgery with no evidence of recurrent disease. The labs were reviewed in detail and were entirely normal.  I have encouraged her to continue to be physically active, but she states this will be no challenge because she loves working out!  Laqueena will have a repeat mammogram this November. She will return for labs and an office visit in 1 year. She understands and agrees with this plan. She has been encouraged to call with any issues that might arise before her next visit here.   Laurie Panda, NP 02/25/2015

## 2015-02-25 NOTE — Telephone Encounter (Signed)
Appointments made and avs printed for patient  Penny Foster

## 2015-08-18 ENCOUNTER — Other Ambulatory Visit: Payer: Self-pay

## 2015-08-18 ENCOUNTER — Other Ambulatory Visit: Payer: Self-pay | Admitting: Oncology

## 2015-08-18 DIAGNOSIS — Z853 Personal history of malignant neoplasm of breast: Secondary | ICD-10-CM

## 2015-11-07 ENCOUNTER — Ambulatory Visit
Admission: RE | Admit: 2015-11-07 | Discharge: 2015-11-07 | Disposition: A | Discharge status: Home / Self Care | Payer: 59 | Source: Ambulatory Visit | Attending: Oncology | Admitting: Oncology

## 2015-11-07 DIAGNOSIS — Z853 Personal history of malignant neoplasm of breast: Secondary | ICD-10-CM

## 2016-02-24 ENCOUNTER — Other Ambulatory Visit (HOSPITAL_BASED_OUTPATIENT_CLINIC_OR_DEPARTMENT_OTHER): Payer: BLUE CROSS/BLUE SHIELD

## 2016-02-24 ENCOUNTER — Telehealth: Payer: Self-pay | Admitting: Nurse Practitioner

## 2016-02-24 ENCOUNTER — Ambulatory Visit (HOSPITAL_BASED_OUTPATIENT_CLINIC_OR_DEPARTMENT_OTHER): Payer: BLUE CROSS/BLUE SHIELD | Admitting: Nurse Practitioner

## 2016-02-24 ENCOUNTER — Encounter: Payer: Self-pay | Admitting: Nurse Practitioner

## 2016-02-24 VITALS — BP 142/71 | HR 70 | Temp 98.0°F | Resp 18 | Ht 62.0 in | Wt 182.2 lb

## 2016-02-24 DIAGNOSIS — C50411 Malignant neoplasm of upper-outer quadrant of right female breast: Secondary | ICD-10-CM

## 2016-02-24 DIAGNOSIS — Z853 Personal history of malignant neoplasm of breast: Secondary | ICD-10-CM | POA: Diagnosis not present

## 2016-02-24 DIAGNOSIS — C50511 Malignant neoplasm of lower-outer quadrant of right female breast: Secondary | ICD-10-CM

## 2016-02-24 LAB — COMPREHENSIVE METABOLIC PANEL
ALT: 17 U/L (ref 0–55)
AST: 19 U/L (ref 5–34)
Albumin: 3.8 g/dL (ref 3.5–5.0)
Alkaline Phosphatase: 87 U/L (ref 40–150)
Anion Gap: 11 mEq/L (ref 3–11)
BUN: 13.1 mg/dL (ref 7.0–26.0)
CALCIUM: 9.5 mg/dL (ref 8.4–10.4)
CO2: 25 mEq/L (ref 22–29)
CREATININE: 1 mg/dL (ref 0.6–1.1)
Chloride: 106 mEq/L (ref 98–109)
EGFR: 74 mL/min/{1.73_m2} — ABNORMAL LOW (ref >90)
Glucose: 103 mg/dl (ref 70–140)
Potassium: 3.5 mEq/L (ref 3.5–5.1)
Sodium: 141 mEq/L (ref 136–145)
Total Bilirubin: 0.41 mg/dL (ref 0.20–1.20)
Total Protein: 8.6 g/dL — ABNORMAL HIGH (ref 6.4–8.3)

## 2016-02-24 LAB — CBC WITH DIFFERENTIAL/PLATELET
BASO%: 1.2 % (ref 0.0–2.0)
Basophils Absolute: 0.1 10*3/uL (ref 0.0–0.1)
EOS%: 2.1 % (ref 0.0–7.0)
Eosinophils Absolute: 0.2 10*3/uL (ref 0.0–0.5)
HCT: 44.3 % (ref 34.8–46.6)
HGB: 14.5 g/dL (ref 11.6–15.9)
LYMPH%: 30.7 % (ref 14.0–49.7)
MCH: 27.9 pg (ref 25.1–34.0)
MCHC: 32.7 g/dL (ref 31.5–36.0)
MCV: 85.5 fL (ref 79.5–101.0)
MONO#: 0.7 10*3/uL (ref 0.1–0.9)
MONO%: 6.3 % (ref 0.0–14.0)
NEUT#: 6.2 10*3/uL (ref 1.5–6.5)
NEUT%: 59.7 % (ref 38.4–76.8)
Platelets: ADEQUATE 10*3/uL (ref 145–400)
RBC: 5.18 10*6/uL (ref 3.70–5.45)
RDW: 14.4 % (ref 11.2–14.5)
WBC: 10.4 10*3/uL — ABNORMAL HIGH (ref 3.9–10.3)
lymph#: 3.2 10*3/uL (ref 0.9–3.3)

## 2016-02-24 NOTE — Telephone Encounter (Signed)
appt made and avs printed °

## 2016-02-24 NOTE — Progress Notes (Signed)
ID: Penny Foster   DOB: [birth year]  MR#: [id number]  YYT#:[id number]  PCP: Jene Every, MD SU: Rolm Bookbinder MD GYN: OTHER MD: Kyung Rudd, MD  CHIEF COMPLAINT:  Hx of Right Breast Cancer CURRENT TREATMENT: observation  BREAST CANCER HISTORY: The patient had a screening mammogram at [facility] in October 2012 showing new calcifications in the right breast. Repeat right diagnostic mammography on 10/04/2011 showed a cluster of heterogeneous calcifications spanning 2.4 cm. 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  With this information the patient was referred for bilateral breast MRI, performed 10/31/2011. This showed a 2 cm area of abnormal clumped nodular enhancement in the right breast 9:00 location extending posteriorly from the biopsy clip. There were no other areas of abnormal enhancement and no evidence of lymph node involvement.  The patient proceeded to right lumpectomyDecember 2012, the pathology showing in addition to her DCIS, 3 areas of invasion, the largest measuring 0.26 cm. The invasive disease was strongly estrogen receptor positive, progesterone receptor negative, but HER-2 amplified. Later the same month the patient proceeded to sentinel lymph node sampling, and this showed 4 sentinel lymph nodes to be clear.   Subsequent treatment history is as detailed below.  INTERVAL HISTORY: [name] returns today for follow up of her right breast cancer. She declined antiestrogen therapy several years ago, but has had no issues so far. The interval history is unremarkable. She works out every day and makes healthy meal choices. She is retired and spends most of her time adoring her grandchildren. She is in good health. Her blood pressure is moderately controlled.   REVIEW OF SYSTEMS: A  detailed review of systems was conducted and is entirely negative today.   PAST MEDICAL HISTORY: Past Medical History  Diagnosis Date  . Meniere disease   . Complication of anesthesia   . PONV (postoperative nausea and vomiting)   . Breast cancer 10/25/11    R breast bx- DCIS, ER/PR+  . Hypertension   . Hx of radiation therapy 01/08/12 to 02/20/12    R breast    PAST SURGICAL HISTORY: Past Surgical History  Procedure Laterality Date  . Inner ear surgery    . Cholecystectomy    . Appendectomy    . Breast lumpectomy  11/15/2011    Procedure: LUMPECTOMY;  Surgeon: Rolm Bookbinder, MD;  Location: [facility];  Service: General;  Laterality: Right;  right breast wire guided lumpectomy , bracketed  . Breast surgery      right axillary sentinel node biopsy    FAMILY HISTORY Family History  Problem Relation Age of Onset  . Cancer Sister     stomach  . Cancer Brother     lung  The patient's father died at the age of 54; her mother died at the age of 31. She has 4 brothers and 5 sisters. One sister had stomach cancer, one brother had lung cancer. There is no history of breast or ovarian cancer in the family.   GYNECOLOGIC HISTORY: Menarche age 30, first pregnancy to term age 65. She is GX P2. She does not recall how long ago she stopped having periods. She never used hormone replacement or birth control pills.  SOCIAL HISTORY:  (Updated 02/24/2014) She worked as Probation officer, now retired. Her husband [name] is a retired Chief Financial Officer although he rose to  be a vice president of the company. Son [name] lives in [place] where he works in [employer]. Daughter [name] is Mudlogger of the [facility] group home for disabled adults program. The patient has one grandchild. She attends the [employer],    ADVANCED DIRECTIVES:  HEALTH MAINTENANCE:  (Updated 02/24/2014) Social History  Substance Use Topics  . Smoking status:  Never Smoker   . Smokeless tobacco: Never Used  . Alcohol Use: No     Colonoscopy:  Not on file  PAP: Not on file  Bone density: Not on file (Approx 2007)  Lipid panel:  Not on file/[facility]   Allergies  Allergen Reactions  . Penicillins Nausea And Vomiting    Current Outpatient Prescriptions  Medication Sig Dispense Refill  . desloratadine (CLARINEX) 5 MG tablet Take 5 mg by mouth daily.     Marland Kitchen diltiazem (CARDIZEM CD) 180 MG 24 hr capsule Take 180 mg by mouth daily.     . fluticasone (FLONASE) 50 MCG/ACT nasal spray     . hydrochlorothiazide (HYDRODIURIL) 25 MG tablet Take 25 mg by mouth daily.     Marland Kitchen KLOR-CON M20 20 MEQ tablet Take 20 mEq by mouth daily. Pt takes (2) 20 mEq tablets daily. Total of 40 mEq a day.    Marland Kitchen LORazepam (ATIVAN) 1 MG tablet Take 1 mg by mouth daily.     Marland Kitchen albuterol (PROVENTIL HFA;VENTOLIN HFA) 108 (90 BASE) MCG/ACT inhaler Inhale 2 puffs into the lungs daily. Reported on 02/24/2016    . ibuprofen (ADVIL,MOTRIN) 200 MG tablet Take 200 mg by mouth every 6 (six) hours as needed. Reported on 02/24/2016     No current facility-administered medications for this visit.    OBJECTIVE: Middle-aged [place] American woman  who appears well and is in no acute distress Filed Vitals:   02/24/16 1010  BP: 142/71  Pulse: 70  Temp: 98 F (36.7 C)  Resp: 18     Body mass index is 33.32 kg/(m^2).    ECOG FS: 0 Filed Weights   02/24/16 1010  Weight: 182 lb 3.2 oz (82.645 kg)   Physical Exam:  Skin: warm, dry  HEENT: sclerae anicteric, conjunctivae pink, oropharynx clear. No thrush or mucositis.  Lymph Nodes: No cervical or supraclavicular lymphadenopathy  Lungs: clear to auscultation bilaterally, no rales, wheezes, or rhonci  Heart: regular rate and rhythm  Abdomen: round, soft, non tender, positive bowel sounds  Musculoskeletal: No focal spinal tenderness, no peripheral edema  Neuro: non focal, well oriented, positive affect  Breast: right breast status post  lumpectomy and radiation. No evidence of recurrent disease. Right axilla benign. Left breast unremarkable.   LAB RESULTS: Lab Results  Component Value Date   WBC 10.4* 02/24/2016   NEUTROABS 6.2 02/24/2016   HGB 14.5 02/24/2016   HCT 44.3 02/24/2016   MCV 85.5 02/24/2016   PLT Clumped Platelets--Appears Adequate 02/24/2016      Chemistry      Component Value Date/Time   NA 141 02/24/2016 0945   NA 141 08/08/2012 1338   K 3.5 02/24/2016 0945   K 3.0* 08/08/2012 1338   CL 101 03/23/2013 1024   CL 103 08/08/2012 1338   CO2 25 02/24/2016 0945   CO2 29 05/26/2012 0849   BUN 13.1 02/24/2016 0945   BUN 7 08/08/2012 1338   CREATININE 1.0 02/24/2016 0945   CREATININE 0.90 08/08/2012 1338      Component Value Date/Time   CALCIUM 9.5 02/24/2016 0945  CALCIUM 9.3 05/26/2012 0849   ALKPHOS 87 02/24/2016 0945   ALKPHOS 63 05/26/2012 0849   AST 19 02/24/2016 0945   AST 16 05/26/2012 0849   ALT 17 02/24/2016 0945   ALT 19 05/26/2012 0849   BILITOT 0.41 02/24/2016 0945   BILITOT 0.4 05/26/2012 0849       STUDIES:  EXAM: DIGITAL DIAGNOSTIC BILATERAL MAMMOGRAM WITH 3D TOMOSYNTHESIS AND CAD  COMPARISON: Previous exam(s).  ACR Breast Density Category c: The breast tissue is heterogeneously dense, which may obscure small masses.  FINDINGS: There are stable lumpectomy changes in the deep upper outer right breast. No mass, nonsurgical distortion, or suspicious microcalcification is identified in either breast to suggest malignancy.  Mammographic images were processed with CAD.  IMPRESSION: No evidence of malignancy in either breast. Lumpectomy changes on the right.  RECOMMENDATION: Diagnostic mammogram is suggested in 1 year. (Code:[id number]-B-01Y)  I have discussed the findings and recommendations with the patient. Results were also provided in writing at the conclusion of the visit. If applicable, a reminder letter will be sent to the patient regarding the next  appointment.  BI-RADS CATEGORY 2: Benign.   Electronically Signed  By: Curlene Dolphin M.D.  On: 11/07/2015 11:11  ASSESSMENT: 65 y.o.  [place], [place] woman   (1) status post right breast biopsy 10/25/2011 for a high-grade ductal carcinoma in situ which was strongly estrogen and progesterone receptor positive  (2) definitive right lumpectomy December of 2012 showing microinvasive disease, the largest of 3 foci measuring 0.26 cm. The invasive disease was estrogen receptor and HER-2 positive, progesterone receptor negative.  (3) She had subsequent sentinel lymph node sampling  December of 2012, with all 4 sentinel lymph nodes negative, for a final stage of T1a N0 or stage IA   (4) radiation therapy completed March 2013  (5) tamoxifen started 04/09/2012, stopped after approximately 6 weeks due to dizziness and multiple other symptoms, which eventually resolved.  Followed with observation alone since that time.   PLAN:  Jenisis looks and feels well today. She is now 4 years out from her definitive surgery with no evidence of recurrent disease. Her most recent mammogram was benign. The labs were reviewed in detail and were entirely stable.  She will continue to be regularly physically active and is enjoying her retirement thus far.  Shatoya will have a repeat mammogram this November. She will return for labs and an office visit in 1 year. At this time she will be eligible to "graduate" from follow up visits. She understands and agrees with this plan. She has been encouraged to call with any issues that might arise before her next visit here.   Laurie Panda, NP 02/24/2016

## 2016-09-03 ENCOUNTER — Other Ambulatory Visit: Payer: Self-pay | Admitting: Oncology

## 2016-09-03 DIAGNOSIS — Z1231 Encounter for screening mammogram for malignant neoplasm of breast: Secondary | ICD-10-CM

## 2016-09-03 DIAGNOSIS — Z853 Personal history of malignant neoplasm of breast: Secondary | ICD-10-CM

## 2016-11-07 ENCOUNTER — Ambulatory Visit
Admission: RE | Admit: 2016-11-07 | Discharge: 2016-11-07 | Disposition: A | Discharge status: Home / Self Care | Payer: BLUE CROSS/BLUE SHIELD | Source: Ambulatory Visit | Attending: Oncology | Admitting: Oncology

## 2016-11-07 DIAGNOSIS — Z853 Personal history of malignant neoplasm of breast: Secondary | ICD-10-CM

## 2016-11-07 DIAGNOSIS — Z1231 Encounter for screening mammogram for malignant neoplasm of breast: Secondary | ICD-10-CM

## 2017-02-20 ENCOUNTER — Other Ambulatory Visit: Payer: Self-pay | Admitting: Adult Health

## 2017-02-20 DIAGNOSIS — C50411 Malignant neoplasm of upper-outer quadrant of right female breast: Secondary | ICD-10-CM

## 2017-02-21 ENCOUNTER — Ambulatory Visit (HOSPITAL_BASED_OUTPATIENT_CLINIC_OR_DEPARTMENT_OTHER): Payer: BLUE CROSS/BLUE SHIELD | Admitting: Oncology

## 2017-02-21 ENCOUNTER — Other Ambulatory Visit (HOSPITAL_BASED_OUTPATIENT_CLINIC_OR_DEPARTMENT_OTHER): Payer: BLUE CROSS/BLUE SHIELD

## 2017-02-21 VITALS — BP 160/83 | HR 63 | Temp 97.4°F | Resp 18 | Ht 62.0 in | Wt 173.6 lb

## 2017-02-21 DIAGNOSIS — C50411 Malignant neoplasm of upper-outer quadrant of right female breast: Secondary | ICD-10-CM

## 2017-02-21 DIAGNOSIS — Z170 Estrogen receptor positive status [ER+]: Principal | ICD-10-CM

## 2017-02-21 DIAGNOSIS — Z853 Personal history of malignant neoplasm of breast: Secondary | ICD-10-CM

## 2017-02-21 LAB — CBC WITH DIFFERENTIAL/PLATELET
BASO%: 0.9 % (ref 0.0–2.0)
Basophils Absolute: 0.1 10*3/uL (ref 0.0–0.1)
EOS ABS: 0.2 10*3/uL (ref 0.0–0.5)
EOS%: 1.9 % (ref 0.0–7.0)
HCT: 41 % (ref 34.8–46.6)
HEMOGLOBIN: 13.8 g/dL (ref 11.6–15.9)
LYMPH%: 32.5 % (ref 14.0–49.7)
MCH: 29 pg (ref 25.1–34.0)
MCHC: 33.6 g/dL (ref 31.5–36.0)
MCV: 86.2 fL (ref 79.5–101.0)
MONO#: 0.6 10*3/uL (ref 0.1–0.9)
MONO%: 6.6 % (ref 0.0–14.0)
NEUT#: 4.9 10*3/uL (ref 1.5–6.5)
NEUT%: 58.1 % (ref 38.4–76.8)
Platelets: 342 10*3/uL (ref 145–400)
RBC: 4.75 10*6/uL (ref 3.70–5.45)
RDW: 13.5 % (ref 11.2–14.5)
WBC: 8.5 10*3/uL (ref 3.9–10.3)
lymph#: 2.8 10*3/uL (ref 0.9–3.3)

## 2017-02-21 LAB — COMPREHENSIVE METABOLIC PANEL
ALBUMIN: 3.7 g/dL (ref 3.5–5.0)
ALK PHOS: 90 U/L (ref 40–150)
ALT: 13 U/L (ref 0–55)
AST: 14 U/L (ref 5–34)
Anion Gap: 10 mEq/L (ref 3–11)
BUN: 13.4 mg/dL (ref 7.0–26.0)
CHLORIDE: 105 meq/L (ref 98–109)
CO2: 26 mEq/L (ref 22–29)
Calcium: 9.8 mg/dL (ref 8.4–10.4)
Creatinine: 0.9 mg/dL (ref 0.6–1.1)
EGFR: 82 mL/min/{1.73_m2} — ABNORMAL LOW (ref >90)
GLUCOSE: 87 mg/dL (ref 70–140)
POTASSIUM: 3.3 meq/L — AB (ref 3.5–5.1)
SODIUM: 141 meq/L (ref 136–145)
Total Bilirubin: 0.54 mg/dL (ref 0.20–1.20)
Total Protein: 8.1 g/dL (ref 6.4–8.3)

## 2017-02-21 NOTE — Progress Notes (Signed)
ID: Penny Foster   DOB: [birth year]  MR#: [id number]  VCB#:[id number]  PCP: Jene Every, MD SU: Rolm Bookbinder MD GYN: OTHER MD: Kyung Rudd, MD  CHIEF COMPLAINT:  Hx of Right Breast Cancer  CURRENT TREATMENT: observation  BREAST CANCER HISTORY: From the original intake note:  The patient had a screening mammogram at [facility] in October 2012 showing new calcifications in the right breast. Repeat right diagnostic mammography on 10/04/2011 showed a cluster of heterogeneous calcifications spanning 2.4 cm. 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  With this information the patient was referred for bilateral breast MRI, performed 10/31/2011. This showed a 2 cm area of abnormal clumped nodular enhancement in the right breast 9:00 location extending posteriorly from the biopsy clip. There were no other areas of abnormal enhancement and no evidence of lymph node involvement.  The patient proceeded to right lumpectomyDecember 2012, the pathology showing in addition to her DCIS, 3 areas of invasion, the largest measuring 0.26 cm. The invasive disease was strongly estrogen receptor positive, progesterone receptor negative, but HER-2 amplified. Later the same month the patient proceeded to sentinel lymph node sampling, and this showed 4 sentinel lymph nodes to be clear.   Subsequent treatment history is as detailed below.  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 Interval history is unremarkable. She continues to exercise regularly, although now because of knee problems she does more in chair exercises every morning for 30 minutes.  REVIEW OF SYSTEMS: A detailed review of systems today was otherwise entirely  negative  PAST MEDICAL HISTORY: Past Medical History:  Diagnosis Date  . Breast cancer ([place]) 10/25/11   R breast bx- DCIS, ER/PR+  . Complication of anesthesia   . Hx of radiation therapy 01/08/12 to 02/20/12   R breast  . Hypertension   . Meniere disease   . PONV (postoperative nausea and vomiting)     PAST SURGICAL HISTORY: Past Surgical History:  Procedure Laterality Date  . APPENDECTOMY    . BREAST LUMPECTOMY  11/15/2011   Procedure: LUMPECTOMY;  Surgeon: Rolm Bookbinder, MD;  Location: [facility];  Service: General;  Laterality: Right;  right breast wire guided lumpectomy , bracketed  . BREAST SURGERY     right axillary sentinel node biopsy  . CHOLECYSTECTOMY    . INNER EAR SURGERY      FAMILY HISTORY Family History  Problem Relation Age of Onset  . Cancer Sister     stomach  . Cancer Brother     lung  The patient's father died at the age of 12; her mother died at the age of 69. She has 4 brothers and 5 sisters. One sister had stomach cancer, one brother had lung cancer. There is no history of breast or ovarian cancer in the family.   GYNECOLOGIC HISTORY: Menarche age 11, first pregnancy to term age 44. She is GX P2. She does not recall how long ago she stopped having periods. She never used hormone replacement or birth control pills.  SOCIAL HISTORY:  (Updated 02/24/2014) She worked as Probation officer, now retired. Her husband Penny Foster is a retired Chief Financial Officer although he rose to be a Engineer, maintenance of the company. Son Penny Foster lives in [place] where he works in [employer]. Daughter  Penny Foster is Mudlogger of the [facility] group home for disabled adults program. The patient has one grandchild. She attends the [employer],    ADVANCED DIRECTIVES: Not in place  HEALTH MAINTENANCE:  (Updated 02/24/2014) Social History  Substance Use Topics  . Smoking status: Never Smoker  . Smokeless tobacco: Never Used   . Alcohol use No      Allergies  Allergen Reactions  . Penicillins Nausea And Vomiting    Current Outpatient Prescriptions  Medication Sig Dispense Refill  . albuterol (PROVENTIL HFA;VENTOLIN HFA) 108 (90 BASE) MCG/ACT inhaler Inhale 2 puffs into the lungs daily. Reported on 02/24/2016    . desloratadine (CLARINEX) 5 MG tablet Take 5 mg by mouth daily.     Marland Kitchen diltiazem (CARDIZEM CD) 180 MG 24 hr capsule Take 180 mg by mouth daily.     . fluticasone (FLONASE) 50 MCG/ACT nasal spray     . hydrochlorothiazide (HYDRODIURIL) 25 MG tablet Take 25 mg by mouth daily.     Marland Kitchen ibuprofen (ADVIL,MOTRIN) 200 MG tablet Take 200 mg by mouth every 6 (six) hours as needed. Reported on 02/24/2016    . KLOR-CON M20 20 MEQ tablet Take 20 mEq by mouth daily. Pt takes (2) 20 mEq tablets daily. Total of 40 mEq a day.    Marland Kitchen LORazepam (ATIVAN) 1 MG tablet Take 1 mg by mouth daily.      No current facility-administered medications for this visit.      Objective: Middle-aged African-American woman who appears well  Vitals:   02/21/17 0856  BP: (!) 160/83  Pulse: 63  Resp: 18  Temp: 97.4 F (36.3 C)     Body mass index is 31.75 kg/m.    ECOG FS: 0 Filed Weights   02/21/17 0856  Weight: 173 lb 9.6 oz (78.7 kg)   Sclerae unicteric, EOMs intact Oropharynx clear and moist No cervical or supraclavicular adenopathy Lungs no rales or rhonchi Heart regular rate and rhythm Abd soft, nontender, positive bowel sounds MSK no focal spinal tenderness, no upper extremity lymphedema Neuro: nonfocal, well oriented, appropriate affect Breasts: The right breast as undergone lumpectomy and radiation, with no evidence of local recurrence. The left breast is unremarkable. Both axillae are benign.   LAB RESULTS: Lab Results  Component Value Date   WBC 8.5 02/21/2017   NEUTROABS 4.9 02/21/2017   HGB 13.8 02/21/2017   HCT 41.0 02/21/2017   MCV 86.2 02/21/2017   PLT 342 02/21/2017      Chemistry      Component  Value Date/Time   NA 141 02/21/2017 0822   K 3.3 (L) 02/21/2017 0822   CL 101 03/23/2013 1024   CO2 26 02/21/2017 0822   BUN 13.4 02/21/2017 0822   CREATININE 0.9 02/21/2017 0822      Component Value Date/Time   CALCIUM 9.8 02/21/2017 0822   ALKPHOS 90 02/21/2017 0822   AST 14 02/21/2017 0822   ALT 13 02/21/2017 0822   BILITOT 0.54 02/21/2017 0822       STUDIES:   CLINICAL DATA:  History of right breast cancer in 2012 status post lumpectomy and radiation therapy.  EXAM: 2D DIGITAL DIAGNOSTIC BILATERAL MAMMOGRAM WITH CAD AND ADJUNCT TOMO  COMPARISON:  Previous exam(s).  ACR Breast Density Category c: The breast tissue is heterogeneously dense, which may obscure small masses.  FINDINGS: There are stable postsurgical changes within the right breast. There are no new dominant masses, suspicious calcifications or secondary signs of malignancy within either breast.  Mammographic images were processed with CAD.  IMPRESSION: No evidence of malignancy within either breast. Stable postsurgical change within the right breast.  RECOMMENDATION: Bilateral diagnostic mammogram in 1 year.  I have discussed the findings and recommendations with the patient. Results were also provided in writing at the conclusion of the visit. If applicable, a reminder letter will be sent to the patient regarding the next appointment.  BI-RADS CATEGORY  2: Benign.   Electronically Signed   By: Franki Cabot M.D.   On: 11/07/2016 08:15   ASSESSMENT: 66 y.o.  [employer], [place] woman   (1) status post right breast biopsy 10/25/2011 for a high-grade ductal carcinoma in situ which was strongly estrogen and progesterone receptor positive  (2) definitive right lumpectomy December of 2012 showing microinvasive disease, the largest of 3 foci measuring 0.26 cm. The invasive disease was estrogen receptor and HER-2 positive, progesterone receptor negative.  (3) She had  subsequent sentinel lymph node sampling  December of 2012, with all 4 sentinel lymph nodes negative, for a final stage of T1a N0 or stage IA   (4) radiation therapy completed March 2013  (5) tamoxifen started 04/09/2012, stopped after approximately 6 weeks due to dizziness and multiple other symptoms, which eventually resolved.  Followed with observation alone since that time.   PLAN:  Dakotah is now a little over 5 years out from definitive surgery for her breast cancer with no evidence of disease recurrence. This is very favorable.  At this point I'm comfortable releasing her to her primary care physician. All Kharma will need in terms of breast cancer follow-up is yearly mammography, which she obtains in November at the [facility], and yearly physician breast exam  I will be glad to see her at any point in the future if on when the need arises, but as of now are making no further routine appointment for her here. Chauncey Cruel, MD 02/21/17

## 2017-09-16 ENCOUNTER — Other Ambulatory Visit: Payer: Self-pay | Admitting: Hematology and Oncology

## 2017-09-16 ENCOUNTER — Other Ambulatory Visit: Payer: Self-pay | Admitting: Internal Medicine

## 2017-09-16 DIAGNOSIS — Z853 Personal history of malignant neoplasm of breast: Secondary | ICD-10-CM

## 2017-11-08 ENCOUNTER — Ambulatory Visit
Admission: RE | Admit: 2017-11-08 | Discharge: 2017-11-08 | Disposition: A | Discharge status: Home / Self Care | Payer: No Typology Code available for payment source | Source: Ambulatory Visit | Attending: Obstetrics and Gynecology | Admitting: Obstetrics and Gynecology

## 2017-11-08 DIAGNOSIS — Z853 Personal history of malignant neoplasm of breast: Secondary | ICD-10-CM

## 2017-11-08 HISTORY — DX: Personal history of irradiation: Z92.3

## 2018-09-29 ENCOUNTER — Telehealth: Payer: Self-pay | Admitting: Adult Health

## 2018-09-29 ENCOUNTER — Emergency Department (HOSPITAL_COMMUNITY)
Admission: EM | Admit: 2018-09-29 | Discharge: 2018-09-29 | Disposition: A | Discharge status: Home / Self Care | Payer: Medicare Other | Attending: Emergency Medicine | Admitting: Emergency Medicine

## 2018-09-29 ENCOUNTER — Encounter (HOSPITAL_COMMUNITY): Payer: Self-pay

## 2018-09-29 ENCOUNTER — Other Ambulatory Visit: Payer: Self-pay

## 2018-09-29 ENCOUNTER — Telehealth: Payer: Self-pay | Admitting: *Deleted

## 2018-09-29 DIAGNOSIS — Z853 Personal history of malignant neoplasm of breast: Secondary | ICD-10-CM | POA: Diagnosis not present

## 2018-09-29 DIAGNOSIS — R112 Nausea with vomiting, unspecified: Secondary | ICD-10-CM | POA: Diagnosis present

## 2018-09-29 DIAGNOSIS — I10 Essential (primary) hypertension: Secondary | ICD-10-CM | POA: Insufficient documentation

## 2018-09-29 DIAGNOSIS — R5383 Other fatigue: Secondary | ICD-10-CM

## 2018-09-29 DIAGNOSIS — R110 Nausea: Secondary | ICD-10-CM

## 2018-09-29 LAB — URINALYSIS, ROUTINE W REFLEX MICROSCOPIC
Bacteria, UA: NONE SEEN
Bilirubin Urine: NEGATIVE
Glucose, UA: NEGATIVE mg/dL
Ketones, ur: NEGATIVE mg/dL
Nitrite: NEGATIVE
Protein, ur: NEGATIVE mg/dL
Specific Gravity, Urine: 1.01 (ref 1.005–1.030)
pH: 5 (ref 5.0–8.0)

## 2018-09-29 LAB — CBC
HCT: 45.7 % (ref 36.0–46.0)
Hemoglobin: 14.4 g/dL (ref 12.0–15.0)
MCH: 27.4 pg (ref 26.0–34.0)
MCHC: 31.5 g/dL (ref 30.0–36.0)
MCV: 87 fL (ref 80.0–100.0)
Platelets: 353 10*3/uL (ref 150–400)
RBC: 5.25 MIL/uL — ABNORMAL HIGH (ref 3.87–5.11)
RDW: 14 % (ref 11.5–15.5)
WBC: 9.8 10*3/uL (ref 4.0–10.5)
nRBC: 0 % (ref 0.0–0.2)

## 2018-09-29 LAB — COMPREHENSIVE METABOLIC PANEL
ALT: 15 U/L (ref 0–44)
AST: 17 U/L (ref 15–41)
Albumin: 3.9 g/dL (ref 3.5–5.0)
Alkaline Phosphatase: 69 U/L (ref 38–126)
Anion gap: 7 (ref 5–15)
BUN: 15 mg/dL (ref 8–23)
CO2: 20 mmol/L — ABNORMAL LOW (ref 22–32)
Calcium: 9.1 mg/dL (ref 8.9–10.3)
Chloride: 112 mmol/L — ABNORMAL HIGH (ref 98–111)
Creatinine, Ser: 0.92 mg/dL (ref 0.44–1.00)
GFR calc Af Amer: >60 mL/min (ref >60)
GFR calc non Af Amer: >60 mL/min (ref >60)
Glucose, Bld: 109 mg/dL — ABNORMAL HIGH (ref 70–99)
Potassium: 4.3 mmol/L (ref 3.5–5.1)
Sodium: 139 mmol/L (ref 135–145)
Total Bilirubin: 0.4 mg/dL (ref 0.3–1.2)
Total Protein: 8.1 g/dL (ref 6.5–8.1)

## 2018-09-29 LAB — LIPASE, BLOOD: Lipase: 22 U/L (ref 11–51)

## 2018-09-29 MED ORDER — ONDANSETRON 4 MG PO TBDP
4.0000 mg | ORAL_TABLET | Freq: Once | ORAL | Status: AC | PRN
  Administered 2018-09-29: 4 mg via ORAL
  Filled 2018-09-29: qty 1

## 2018-09-29 MED ORDER — ONDANSETRON 4 MG PO TBDP: 4.0000 mg | ORAL_TABLET | Freq: Three times a day (TID) | ORAL | 0 refills | Status: AC | PRN

## 2018-09-29 NOTE — ED Triage Notes (Signed)
Patient states she has had constant nausea and fatigue since 09/03/18 and intermittent vomiting since receiving her flu shot on 09/03/18. Patient states she "also feels hot all the time."

## 2018-09-29 NOTE — Telephone Encounter (Signed)
Pt left VM early am and then called again at approximately 10 am with this RN able to speak to her.  Havilah stated " I haven't been seen for 2 years but I would like to be seen ASAP because ever since I got the flu shot in September I haven't felt good "  Marry states she is " having hot flashes,nausea and just not feeling good which is how I felt when I was on the tamoxifen so I thought Dr Jana Hakim could help find what is wrong with me "  This RN validated the patients current issues but informed her concerns best to be worked up by a primary MD.  Jana Half then stated she went to the ER and " they did test on me and didn't give me an answer as to why I am feeling this way"  Pt presently does not have a primary MD " I changed insurance so I am supposed to see a new doctor but I don't know who that is yet"  This RN advised pt she should see an MD at an Urgent Care for her concerns - an appointment can be made here and we do need to see her but for her routine breast cancer history.  Informed her currently next availability may not be until at least 2 weeks.  Reilynn stated she would like to go ahead and get an appointment for follow up.

## 2018-09-29 NOTE — ED Provider Notes (Signed)
[facility] DEPT Provider Note   CSN: [id number] Arrival date & time: 09/29/18  1321   History   Chief Complaint Chief Complaint  Patient presents with  . Emesis    HPI Penny Foster is a 67 y.o. female past medical history significant for breast cancer (2012), hypertension who presents for evaluation of intermittent emesis and fatigue.  Patient states this started on 09/03/2018 when she received her influenza vaccine.  Patient states at this visit her blood pressure medicines were also changed.  Patient states she has had intermittent hot flashes.  Patient states she feels like she is going through "menopause all over again." Denies fever, chills, headache, chest pain, weakness, shortness of breath, abdominal pain, dysuria, diarrhea.  Patient states she has been seen 2 times in the past week [facility] emergency department for these issues. Denies aggravating or alleviating factors for her symptoms.  HPI  Past Medical History:  Diagnosis Date  . Breast cancer ([place]) 10/25/11   R breast bx- DCIS, ER/PR+  . Complication of anesthesia   . Hx of radiation therapy 01/08/12 to 02/20/12   R breast  . Hypertension   . Meniere disease   . Personal history of radiation therapy   . PONV (postoperative nausea and vomiting)     Patient Active Problem List   Diagnosis Date Noted  . Hx of radiation therapy   . Hypertension   . Malignant neoplasm of upper-outer quadrant of right breast in female, estrogen receptor positive ([place]) 11/08/2011  . Cancer of lower-outer quadrant of female breast ([place]) 11/07/2011  . Breast cancer ([place]) 10/25/2011    Past Surgical History:  Procedure Laterality Date  . APPENDECTOMY    . BREAST LUMPECTOMY  11/15/2011   Procedure: LUMPECTOMY;  Surgeon: Rolm Bookbinder, MD;  Location: [facility];  Service: General;  Laterality: Right;  right breast wire guided lumpectomy , bracketed  . BREAST SURGERY     right axillary  sentinel node biopsy  . CHOLECYSTECTOMY    . INNER EAR SURGERY       OB History    Gravida  2   Para  2   Term      Preterm      AB      Living        SAB      TAB      Ectopic      Multiple      Live Births           Obstetric Comments  Menarche age 57, first pregnancy to term age 10. She is GXP2. She does not recall how long ago she stopped having periods. She never used hormone replacement or birth control pills         Home Medications    Prior to Admission medications   Medication Sig Start Date End Date Taking? Authorizing Provider  albuterol (PROVENTIL HFA;VENTOLIN HFA) 108 (90 BASE) MCG/ACT inhaler Inhale 2 puffs into the lungs daily. Reported on 02/24/2016   Yes [provider]  busPIRone (BUSPAR) 10 MG tablet Take 10 mg by mouth 3 (three) times daily as needed (anxiety).  09/24/18  Yes [provider]  diltiazem (CARDIZEM CD) 180 MG 24 hr capsule Take 180 mg by mouth daily.  02/12/14  Yes [provider]  escitalopram (LEXAPRO) 5 MG tablet Take 5 mg by mouth daily. 09/03/18  Yes [provider]  famotidine (PEPCID) 20 MG tablet Take 20 mg by mouth 2 (  two) times daily. Take 1 tablet by mouth twice a day for 15 days. 09/24/18  Yes [provider]  gabapentin (NEURONTIN) 100 MG capsule Take 100 mg by mouth every evening. 09/04/18  Yes [provider]  KLOR-CON M20 20 MEQ tablet Take 40-60 mEq by mouth See admin instructions. Take 6mEq by mouth in the morning and 62mEq at night. 09/28/11  Yes [provider]  losartan (COZAAR) 50 MG tablet Take 50 mg by mouth daily. 09/25/18  Yes [provider]  promethazine (PHENERGAN) 25 MG tablet Take 25 mg by mouth every 6 (six) hours as needed for nausea. 09/27/18  Yes [provider]  ondansetron (ZOFRAN ODT) 4 MG disintegrating tablet Take 1 tablet (4 mg total) by mouth every 8 (eight) hours as needed for nausea or vomiting. 09/29/18    Jachob Mcclean A, PA-C    Family History Family History  Problem Relation Age of Onset  . Cancer Sister        stomach  . Cancer Brother        lung    Social History Social History   Tobacco Use  . Smoking status: Never Smoker  . Smokeless tobacco: Never Used  Substance Use Topics  . Alcohol use: No  . Drug use: No     Allergies   Levofloxacin and Penicillins   Review of Systems Review of Systems  Constitutional: Positive for fatigue. Negative for activity change, appetite change, chills, diaphoresis, fever and unexpected weight change.  HENT: Negative.   Respiratory: Negative.   Cardiovascular: Negative.   Gastrointestinal: Positive for nausea and vomiting. Negative for abdominal distention, abdominal pain, anal bleeding, blood in stool, constipation, diarrhea and rectal pain.  Genitourinary: Negative.   Musculoskeletal: Negative.   Skin: Negative.   Neurological: Negative.   All other systems reviewed and are negative.    Physical Exam Updated Vital Signs BP (!) 164/90 (BP Location: Left Arm)   Pulse 65   Temp 98.1 F (36.7 C) (Oral)   Resp 18   Ht 5\' 2"  (1.575 m)   Wt 78.5 kg   SpO2 99%   BMI 31.64 kg/m   Physical Exam  Physical Exam  Constitutional: Pt is oriented to person, place, and time. Pt appears well-developed and well-nourished. No distress.  HENT:  Head: Normocephalic and atraumatic.  Mouth/Throat: Oropharynx is clear and moist.  Eyes: Conjunctivae and EOM are normal. Pupils are equal, round, and reactive to light. No scleral icterus.  No horizontal, vertical or rotational nystagmus  Neck: Normal range of motion. Neck supple.  Full active and passive ROM without pain No midline or paraspinal tenderness No nuchal rigidity or meningeal signs  Cardiovascular: Normal rate, regular rhythm and intact distal pulses.   Pulmonary/Chest: Effort normal and breath sounds normal. No respiratory distress. Pt has no wheezes. No rales.    Abdominal: Soft. Bowel sounds are normal. There is no tenderness. There is no rebound and no guarding.  Musculoskeletal: Normal range of motion.  Lymphadenopathy:    No cervical adenopathy.  Neurological: Pt. is alert and oriented to person, place, and time. He has normal reflexes. No cranial nerve deficit.  Exhibits normal muscle tone. Coordination normal.  Mental Status:  Alert, oriented, thought content appropriate. Speech fluent without evidence of aphasia. Able to follow 2 step commands without difficulty.  Cranial Nerves:  II:  Peripheral visual fields grossly normal, pupils equal, round, reactive to light III,IV, VI: ptosis not present, extra-ocular motions intact bilaterally  V,VII: smile symmetric,  facial light touch sensation equal VIII: hearing grossly normal bilaterally  IX,X: midline uvula rise  XI: bilateral shoulder shrug equal and strong XII: midline tongue extension  Motor:  5/5 in upper and lower extremities bilaterally including strong and equal grip strength and dorsiflexion/plantar flexion Sensory: Pinprick and light touch normal in all extremities.  Deep Tendon Reflexes: 2+ and symmetric  Cerebellar: normal finger-to-nose with bilateral upper extremities Gait: normal gait and balance CV: distal pulses palpable throughout   Skin: Skin is warm and dry. No rash noted. Pt is not diaphoretic.  Psychiatric: Pt has a normal mood and affect. Behavior is normal. Judgment and thought content normal.  Nursing note and vitals reviewed. ED Treatments / Results  Labs (all labs ordered are listed, but only abnormal results are displayed) Labs Reviewed  COMPREHENSIVE METABOLIC PANEL - Abnormal; Notable for the following components:      Result Value   Chloride 112 (*)    CO2 20 (*)    Glucose, Bld 109 (*)    All other components within normal limits  CBC - Abnormal; Notable for the following components:   RBC 5.25 (*)    All other components within normal limits   URINALYSIS, ROUTINE W REFLEX MICROSCOPIC - Abnormal; Notable for the following components:   Color, Urine STRAW (*)    Hgb urine dipstick SMALL (*)    Leukocytes, UA TRACE (*)    All other components within normal limits  LIPASE, BLOOD    EKG None  Radiology No results found.  Procedures Procedures (including critical care time)  Medications Ordered in ED Medications  ondansetron (ZOFRAN-ODT) disintegrating tablet 4 mg (4 mg Oral Given 09/29/18 1358)     Initial Impression / Assessment and Plan / ED Course  I have reviewed the triage vital signs and the nursing notes.  Pertinent labs & imaging results that were available during my care of the patient were reviewed by me and considered in my medical decision making (see chart for details).  67 year old female presents for evaluation of fatigue x1 month and intermittent nausea and vomiting.  Afebrile, nonseptic, non-ill-appearing. Has been evaluated 2 times the past week for same issues.  Patient originally attributed this to her recent influenza vaccine, however she was also started on additional blood pressure medicine at this time.  Neurologic exam without neuro deficits.  Nontender abdomen without rebound or guarding.  No chest pain or shortness of breath.  Labs negative, urine negative.  Patient does not want fluid bolus at this time.  Nausea is improved with Zofran ODT.  She is clinically well-appearing and hemodynamically stable.  Discussed with patient follow-up with her primary care provider for reevaluation at the end of the week.  Discussed with patient strict return precautions.  Will DC patient home with Zofran for symptomatic treatment.  She is stable for DC home at this time.  She voiced understanding is agreeable for follow-up.  Patient was seen and evaluated by my attending Dr. Johnney Killian who agrees with the above treatment, plan and disposition of patient.   Final Clinical Impressions(s) / ED Diagnoses   Final  diagnoses:  Fatigue, unspecified type  Nausea    ED Discharge Orders         Ordered    ondansetron (ZOFRAN ODT) 4 MG disintegrating tablet  Every 8 hours PRN     09/29/18 1822           Natale Thoma A, PA-C 09/29/18 1827    Charlesetta Shanks, MD 09/30/18  1725  

## 2018-09-29 NOTE — Telephone Encounter (Signed)
Spoke to pt regarding upcoming appts per 10/21 sch message.  °

## 2018-09-29 NOTE — Discharge Instructions (Signed)
Evaluated today for nausea, vomiting and fatigue.   Your lab work and urine were negative. Your symptoms may be coming from your additional blood pressure medication. I would recommend reevaluation with your PCP at the end of this week.  Return to the ED with any new or worsening symptoms such as: Contact a doctor if: You have a headache. You have new symptoms. You feel sicker to your stomach. You have a fever. You feel light-headed or dizzy. You throw up. You are not able to keep fluids down. Get help right away if: You have pain in your chest, neck, arm, or jaw. You feel very weak or you pass out (faint). You have throw up that is bright red or looks like coffee grounds. You have bloody or black poop (stools), or poop that looks like tar. You have a very bad headache, a stiff neck, or both. You have very bad pain, cramping, or bloating in your belly. You have a rash. You have trouble breathing or you are breathing very quickly. Your heart is beating very quickly. Your skin feels cold and clammy. You feel confused. You have pain while peeing. You have signs of dehydration, such as: Dark pee, or very little or no pee. Cracked lips. Dry mouth. Sunken eyes. Sleepiness. Weakness.

## 2018-09-29 NOTE — ED Provider Notes (Signed)
Medical screening examination/treatment/procedure(s) were conducted as a shared visit with non-physician practitioner(s) and myself.  I personally evaluated the patient during the encounter.  None Patient reports recurrent issues with vomiting since the end of September.  The symptoms come and go.  She denies any headache or visual changes.  No dizziness.  No abdominal pain or chest pain.  She had first was attributing it to her flu shot but now has been almost 4 weeks ago.  Is alert and nontoxic.  She is clinically well in appearance.  Lungs are clear.  Heart is regular.  Abdomen is soft and nontender.  Movements are coordinated purposeful symmetric.  Agnostic evaluation is without significant abnormality.  Patient is stable for discharge.  She will be following up with her PCP at the end of the week.  She is advised to continue seeing her primary provider for further diagnostic evaluation if symptoms are persisting.  He will be given Zofran for symptomatic treatment.   Charlesetta Shanks, MD 09/29/18 [phone]

## 2018-09-30 ENCOUNTER — Other Ambulatory Visit: Payer: Self-pay | Admitting: Oncology

## 2018-09-30 DIAGNOSIS — Z170 Estrogen receptor positive status [ER+]: Principal | ICD-10-CM

## 2018-09-30 DIAGNOSIS — C50511 Malignant neoplasm of lower-outer quadrant of right female breast: Secondary | ICD-10-CM

## 2018-09-30 DIAGNOSIS — C50411 Malignant neoplasm of upper-outer quadrant of right female breast: Secondary | ICD-10-CM

## 2018-10-13 ENCOUNTER — Inpatient Hospital Stay (HOSPITAL_BASED_OUTPATIENT_CLINIC_OR_DEPARTMENT_OTHER): Payer: Medicare Other | Admitting: Adult Health

## 2018-10-13 ENCOUNTER — Inpatient Hospital Stay: Payer: Medicare Other | Attending: Oncology

## 2018-10-13 ENCOUNTER — Encounter: Payer: Self-pay | Admitting: Adult Health

## 2018-10-13 ENCOUNTER — Telehealth: Payer: Self-pay | Admitting: Adult Health

## 2018-10-13 VITALS — BP 150/84 | HR 66 | Temp 97.8°F | Resp 18 | Ht 62.0 in | Wt 175.6 lb

## 2018-10-13 DIAGNOSIS — Z86000 Personal history of in-situ neoplasm of breast: Secondary | ICD-10-CM | POA: Insufficient documentation

## 2018-10-13 DIAGNOSIS — I10 Essential (primary) hypertension: Secondary | ICD-10-CM | POA: Insufficient documentation

## 2018-10-13 DIAGNOSIS — C50411 Malignant neoplasm of upper-outer quadrant of right female breast: Secondary | ICD-10-CM

## 2018-10-13 DIAGNOSIS — Z923 Personal history of irradiation: Secondary | ICD-10-CM | POA: Diagnosis not present

## 2018-10-13 DIAGNOSIS — Z170 Estrogen receptor positive status [ER+]: Secondary | ICD-10-CM

## 2018-10-13 DIAGNOSIS — Z79899 Other long term (current) drug therapy: Secondary | ICD-10-CM

## 2018-10-13 DIAGNOSIS — C50511 Malignant neoplasm of lower-outer quadrant of right female breast: Secondary | ICD-10-CM

## 2018-10-13 LAB — CBC WITH DIFFERENTIAL/PLATELET
ABS IMMATURE GRANULOCYTES: 0.02 10*3/uL (ref 0.00–0.07)
BASOS PCT: 1 %
Basophils Absolute: 0.1 10*3/uL (ref 0.0–0.1)
Eosinophils Absolute: 0.1 10*3/uL (ref 0.0–0.5)
Eosinophils Relative: 2 %
HCT: 43.7 % (ref 36.0–46.0)
Hemoglobin: 14.1 g/dL (ref 12.0–15.0)
IMMATURE GRANULOCYTES: 0 %
LYMPHS ABS: 2.6 10*3/uL (ref 0.7–4.0)
Lymphocytes Relative: 38 %
MCH: 27.9 pg (ref 26.0–34.0)
MCHC: 32.3 g/dL (ref 30.0–36.0)
MCV: 86.5 fL (ref 80.0–100.0)
Monocytes Absolute: 0.5 10*3/uL (ref 0.1–1.0)
Monocytes Relative: 7 %
NEUTROS ABS: 3.6 10*3/uL (ref 1.7–7.7)
NRBC: 0 % (ref 0.0–0.2)
Neutrophils Relative %: 52 %
PLATELETS: 319 10*3/uL (ref 150–400)
RBC: 5.05 MIL/uL (ref 3.87–5.11)
RDW: 14.2 % (ref 11.5–15.5)
WBC: 6.9 10*3/uL (ref 4.0–10.5)

## 2018-10-13 LAB — COMPREHENSIVE METABOLIC PANEL
ALT: 13 U/L (ref 0–44)
AST: 13 U/L — AB (ref 15–41)
Albumin: 3.4 g/dL — ABNORMAL LOW (ref 3.5–5.0)
Alkaline Phosphatase: 72 U/L (ref 38–126)
Anion gap: 10 (ref 5–15)
BUN: 10 mg/dL (ref 8–23)
CO2: 24 mmol/L (ref 22–32)
Calcium: 9.2 mg/dL (ref 8.9–10.3)
Chloride: 107 mmol/L (ref 98–111)
Creatinine, Ser: 0.88 mg/dL (ref 0.44–1.00)
GFR calc Af Amer: >60 mL/min (ref >60)
Glucose, Bld: 92 mg/dL (ref 70–99)
POTASSIUM: 3.5 mmol/L (ref 3.5–5.1)
Sodium: 141 mmol/L (ref 135–145)
Total Bilirubin: 0.4 mg/dL (ref 0.3–1.2)
Total Protein: 7.5 g/dL (ref 6.5–8.1)

## 2018-10-13 NOTE — Progress Notes (Signed)
ID: Penny Foster   DOB: May 02, 1951  MR#: [id number]  RJJ#:[id number]  PCP: System, Pcp Not In SU: Rolm Bookbinder MD GYN: OTHER MD: Kyung Rudd, MD  CHIEF COMPLAINT:  Hx of Right Breast Cancer  CURRENT TREATMENT: observation  BREAST CANCER HISTORY: From the original intake note:  The patient had a screening mammogram at [facility] in October 2012 showing new calcifications in the right breast. Repeat right diagnostic mammography on 10/04/2011 showed a cluster of heterogeneous calcifications spanning 2.4 cm. 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  With this information the patient was referred for bilateral breast MRI, performed 10/31/2011. This showed a 2 cm area of abnormal clumped nodular enhancement in the right breast 9:00 location extending posteriorly from the biopsy clip. There were no other areas of abnormal enhancement and no evidence of lymph node involvement.  The patient proceeded to right lumpectomyDecember 2012, the pathology showing in addition to her DCIS, 3 areas of invasion, the largest measuring 0.26 cm. The invasive disease was strongly estrogen receptor positive, progesterone receptor negative, but HER-2 amplified. Later the same month the patient proceeded to sentinel lymph node sampling, and this showed 4 sentinel lymph nodes to be clear.   Subsequent treatment history is as detailed below.  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  She is doing well today.  She has no breast concerns.  She has a PCP, Dr. Rosana Hoes in [facility].  She sees Penny Foster regularly.  She is up to date with colon/gyn cancer screenings.  She has not undergone skin cancer screening.  She exercises 5 days per week.    Since her last visit she underwent mammogram on 11/08/2017 that showed breast density of C, with no evidence of malignancy.   REVIEW OF SYSTEMS: Penny Foster is doing well today.  She is without any headaches or vision changes.  She isn't having any new pain, breast changes, abdominal signs and symptoms.  She is without cough, shortness of breath, chest pain, palpitations.  A detailed ROS was otherwise non contributory.    PAST MEDICAL HISTORY: Past Medical History:  Diagnosis Date  . Breast cancer ([place]) 10/25/11   R breast bx- DCIS, ER/PR+  . Complication of anesthesia   . Hx of radiation therapy 01/08/12 to 02/20/12   R breast  . Hypertension   . Meniere disease   . Personal history of radiation therapy   . PONV (postoperative nausea and vomiting)     PAST SURGICAL HISTORY: Past Surgical History:  Procedure Laterality Date  . APPENDECTOMY    . BREAST LUMPECTOMY  11/15/2011   Procedure: LUMPECTOMY;  Surgeon: Rolm Bookbinder, MD;  Location: [facility];  Service: General;  Laterality: Right;  right breast wire guided lumpectomy , bracketed  . BREAST SURGERY     right axillary sentinel node biopsy  . CHOLECYSTECTOMY    . INNER EAR SURGERY      FAMILY HISTORY Family History  Problem Relation Age of Onset  . Cancer Sister        stomach  . Cancer Brother        lung  The patient's father died at the age of 44; her mother died at the age of 61. She has 4 brothers and 5 sisters. One sister had stomach  cancer, one brother had lung cancer. There is no history of breast or ovarian cancer in the family.   GYNECOLOGIC HISTORY: Menarche age 32, first pregnancy to term age 55. She is GX P2. She does not recall how long ago she stopped having periods. She never used hormone replacement or birth control pills.  SOCIAL HISTORY:  (Updated 02/24/2014) She worked as Probation officer, now retired. Her husband Penny Foster is a retired Chief Financial Officer although he rose  to be a Engineer, maintenance of the company. Son Penny Foster lives in [place] where he works in [employer]. Daughter Penny Foster is Mudlogger of the [facility] group home for disabled adults program. The patient has one grandchild. She attends the [employer],    ADVANCED DIRECTIVES: Not in place  HEALTH MAINTENANCE:  (Updated 02/24/2014) Social History   Tobacco Use  . Smoking status: Never Smoker  . Smokeless tobacco: Never Used  Substance Use Topics  . Alcohol use: No  . Drug use: No      Allergies  Allergen Reactions  . Levofloxacin Nausea Only    Other reaction(s): NAUSEA   . Penicillins Nausea And Vomiting    Has patient had a PCN reaction causing immediate rash, facial/tongue/throat swelling, SOB or lightheadedness with hypotension: No Has patient had a PCN reaction causing severe rash involving mucus membranes or skin necrosis: No Has patient had a PCN reaction that required hospitalization: No Has patient had a PCN reaction occurring within the last 10 years: No If all of the above answers are "NO", then may proceed with Cephalosporin use.     Current Outpatient Medications  Medication Sig Dispense Refill  . diltiazem (CARDIZEM CD) 180 MG 24 hr capsule Take 180 mg by mouth daily.     Marland Kitchen gabapentin (NEURONTIN) 100 MG capsule Take 100 mg by mouth every evening.  0  . hydrochlorothiazide (HYDRODIURIL) 25 MG tablet Take 25 mg by mouth daily.  3  . KLOR-CON M20 20 MEQ tablet Take 40-60 mEq by mouth See admin instructions. Take 60mq by mouth in the morning and 470m at night.    . Marland KitchenORazepam (ATIVAN) 1 MG tablet TAKE 1 TABLET (1 MG TOTAL) BY MOUTH EVERY EIGHT (8) HOURS AS NEEDED FOR ANXIETY.  2  . meclizine (ANTIVERT) 25 MG tablet Take 25 mg by mouth 2 (two) times daily as needed for dizziness.    . ondansetron (ZOFRAN ODT) 4 MG disintegrating tablet Take 1 tablet (4 mg total) by mouth every 8 (eight) hours as needed for nausea or vomiting. 20 tablet 0   No  current facility-administered medications for this visit.      Objective:   Vitals:   10/13/18 0842  BP: (!) 150/84  Pulse: 66  Resp: 18  Temp: 97.8 F (36.6 C)  SpO2: 100%     Body mass index is 32.12 kg/m.    ECOG FS: 0 Filed Weights   10/13/18 0842  Weight: 175 lb 9.6 oz (79.7 kg)  GENERAL: Patient is a well appearing female in no acute distress HEENT:  Sclerae anicteric.  Oropharynx clear and moist. No ulcerations or evidence of oropharyngeal candidiasis. Neck is supple.  NODES:  No cervical, supraclavicular, or axillary lymphadenopathy palpated.  BREAST EXAM:  Right breast s/p lumpectomy, no sign of local recurrence.  Left breast benign. LUNGS:  Clear to auscultation bilaterally.  No wheezes or rhonchi. HEART:  Regular rate and rhythm. No murmur appreciated. ABDOMEN:  Soft, nontender.  Positive, normoactive bowel  sounds. No organomegaly palpated. MSK:  No focal spinal tenderness to palpation. Full range of motion bilaterally in the upper extremities. EXTREMITIES:  No peripheral edema.   SKIN:  Clear with no obvious rashes or skin changes. No nail dyscrasia. NEURO:  Nonfocal. Well oriented.  Appropriate affect.    LAB RESULTS: Lab Results  Component Value Date   WBC 6.9 10/13/2018   NEUTROABS 3.6 10/13/2018   HGB 14.1 10/13/2018   HCT 43.7 10/13/2018   MCV 86.5 10/13/2018   PLT 319 10/13/2018      Chemistry      Component Value Date/Time   NA 139 09/29/2018 1412   NA 141 02/21/2017 0822   K 4.3 09/29/2018 1412   K 3.3 (L) 02/21/2017 0822   CL 112 (H) 09/29/2018 1412   CL 101 03/23/2013 1024   CO2 20 (L) 09/29/2018 1412   CO2 26 02/21/2017 0822   BUN 15 09/29/2018 1412   BUN 13.4 02/21/2017 0822   CREATININE 0.92 09/29/2018 1412   CREATININE 0.9 02/21/2017 0822      Component Value Date/Time   CALCIUM 9.1 09/29/2018 1412   CALCIUM 9.8 02/21/2017 0822   ALKPHOS 69 09/29/2018 1412   ALKPHOS 90 02/21/2017 0822   AST 17 09/29/2018 1412   AST 14  02/21/2017 0822   ALT 15 09/29/2018 1412   ALT 13 02/21/2017 0822   BILITOT 0.4 09/29/2018 1412   BILITOT 0.54 02/21/2017 0822       STUDIES:    ASSESSMENT: 67 y.o.  [place], [place] woman   (1) status post right breast biopsy 10/25/2011 for a high-grade ductal carcinoma in situ which was strongly estrogen and progesterone receptor positive  (2) definitive right lumpectomy December of 2012 showing microinvasive disease, the largest of 3 foci measuring 0.26 cm. The invasive disease was estrogen receptor and HER-2 positive, progesterone receptor negative.  (3) She had subsequent sentinel lymph node sampling  December of 2012, with all 4 sentinel lymph nodes negative, for a final stage of T1a N0 or stage IA   (4) radiation therapy completed March 2013  (5) tamoxifen started 04/09/2012, stopped after approximately 6 weeks due to dizziness and multiple other symptoms, which eventually resolved.  Followed with observation alone since that time.   PLAN:  Penny Foster is doing well today.  She has no clinical or radiographic sign of breast cancer recurrence.  She is due for her mammogram in late November/early December.  I anticipate no issues with this.    Penny Foster and I reviewed her health maintenance recommendations.  I recommended she keep up with her cancer screenings and follow with her PCP regularly.    We reviewed recommendations for healthy diet and exercise today.  I gave her a handout on bone health today.    Ilissa will return in 13 months for LTS follow up.  I want her to return after her mammogram in 11/2019 instead of the month before.  She knows to call for any questions or concerns prior to her next appointment with [place].    A total of (30) minutes of face-to-face time was spent with this patient with greater than 50% of that time in counseling and care-coordination.   Scot Dock, NP 10/13/18

## 2018-10-13 NOTE — Telephone Encounter (Signed)
Gave pt avs and calendar  °

## 2018-10-13 NOTE — Patient Instructions (Signed)
Bone Health Bones protect organs, store calcium, and anchor muscles. Good health habits, such as eating nutritious foods and exercising regularly, are important for maintaining healthy bones. They can also help to prevent a condition that causes bones to lose density and become weak and brittle (osteoporosis). Why is bone mass important? Bone mass refers to the amount of bone tissue that you have. The higher your bone mass, the stronger your bones. An important step toward having healthy bones throughout life is to have strong and dense bones during childhood. A young adult who has a high bone mass is more likely to have a high bone mass later in life. Bone mass at its greatest it is called peak bone mass. A large decline in bone mass occurs in older adults. In women, it occurs about the time of menopause. During this time, it is important to practice good health habits, because if more bone is lost than what is replaced, the bones will become less healthy and more likely to break (fracture). If you find that you have a low bone mass, you may be able to prevent osteoporosis or further bone loss by changing your diet and lifestyle. How can I find out if my bone mass is low? Bone mass can be measured with an X-ray test that is called a bone mineral density (BMD) test. This test is recommended for all women who are age 65 or older. It may also be recommended for men who are age 70 or older, or for people who are more likely to develop osteoporosis due to:  Having bones that break easily.  Having a long-term disease that weakens bones, such as kidney disease or rheumatoid arthritis.  Having menopause earlier than normal.  Taking medicine that weakens bones, such as steroids, thyroid hormones, or hormone treatment for breast cancer or prostate cancer.  Smoking.  Drinking three or more alcoholic drinks each day.  What are the nutritional recommendations for healthy bones? To have healthy bones, you  need to get enough of the right minerals and vitamins. Most nutrition experts recommend getting these nutrients from the foods that you eat. Nutritional recommendations vary from person to person. Ask your health care provider what is healthy for you. Here are some general guidelines. Calcium Recommendations Calcium is the most important (essential) mineral for bone health. Most people can get enough calcium from their diet, but supplements may be recommended for people who are at risk for osteoporosis. Good sources of calcium include:  Dairy products, such as low-fat or nonfat milk, cheese, and yogurt.  Dark green leafy vegetables, such as bok choy and broccoli.  Calcium-fortified foods, such as orange juice, cereal, bread, soy beverages, and tofu products.  Nuts, such as almonds.  Follow these recommended amounts for daily calcium intake:  Children, age 1?3: 700 mg.  Children, age 4?8: 1,000 mg.  Children, age 9?13: 1,300 mg.  Teens, age 14?18: 1,300 mg.  Adults, age 19?50: 1,000 mg.  Adults, age 51?70: ? Men: 1,000 mg. ? Women: 1,200 mg.  Adults, age 71 or older: 1,200 mg.  Pregnant and breastfeeding females: ? Teens: 1,300 mg. ? Adults: 1,000 mg.  Vitamin D Recommendations Vitamin D is the most essential vitamin for bone health. It helps the body to absorb calcium. Sunlight stimulates the skin to make vitamin D, so be sure to get enough sunlight. If you live in a cold climate or you do not get outside often, your health care provider may recommend that you take vitamin   D supplements. Good sources of vitamin D in your diet include:  Egg yolks.  Saltwater fish.  Milk and cereal fortified with vitamin D.  Follow these recommended amounts for daily vitamin D intake:  Children and teens, age 1?18: 600 international units.  Adults, age 50 or younger: 400-800 international units.  Adults, age 51 or older: 800-1,000 international units.  Other Nutrients Other nutrients  for bone health include:  Phosphorus. This mineral is found in meat, poultry, dairy foods, nuts, and legumes. The recommended daily intake for adult men and adult women is 700 mg.  Magnesium. This mineral is found in seeds, nuts, dark green vegetables, and legumes. The recommended daily intake for adult men is 400?420 mg. For adult women, it is 310?320 mg.  Vitamin K. This vitamin is found in green leafy vegetables. The recommended daily intake is 120 mg for adult men and 90 mg for adult women.  What type of physical activity is best for building and maintaining healthy bones? Weight-bearing and strength-building activities are important for building and maintaining peak bone mass. Weight-bearing activities cause muscles and bones to work against gravity. Strength-building activities increases muscle strength that supports bones. Weight-bearing and muscle-building activities include:  Walking and hiking.  Jogging and running.  Dancing.  Gym exercises.  Lifting weights.  Tennis and racquetball.  Climbing stairs.  Aerobics.  Adults should get at least 30 minutes of moderate physical activity on most days. Children should get at least 60 minutes of moderate physical activity on most days. Ask your health care provide what type of exercise is best for you. Where can I find more information? For more information, check out the following websites:  National Osteoporosis Foundation: http://nof.org/learn/basics  National Institutes of Health: http://www.niams.nih.gov/Health_Info/Bone/Bone_Health/bone_health_for_life.asp  This information is not intended to replace advice given to you by your health care provider. Make sure you discuss any questions you have with your health care provider. Document Released: 02/16/2004 Document Revised: 06/15/2016 Document Reviewed: 12/01/2014 Elsevier Interactive Patient Education  2018 Elsevier Inc.  

## 2018-10-15 ENCOUNTER — Other Ambulatory Visit: Payer: Self-pay | Admitting: Family

## 2018-10-15 DIAGNOSIS — Z1231 Encounter for screening mammogram for malignant neoplasm of breast: Secondary | ICD-10-CM

## 2018-11-26 ENCOUNTER — Ambulatory Visit
Admission: RE | Admit: 2018-11-26 | Discharge: 2018-11-26 | Disposition: A | Discharge status: Home / Self Care | Payer: Medicare Other | Source: Ambulatory Visit | Attending: Family | Admitting: Family

## 2018-11-26 DIAGNOSIS — Z1231 Encounter for screening mammogram for malignant neoplasm of breast: Secondary | ICD-10-CM

## 2019-02-03 IMAGING — MG DIGITAL SCREENING BILATERAL MAMMOGRAM WITH TOMO AND CAD
6 of 10 series · 6 of 30 positions shown · non-contrast
Comparison: Previous exam(s).

CLINICAL DATA: Screening. History of RIGHT breast cancer and
lumpectomy in 8178.

EXAM:
DIGITAL SCREENING BILATERAL MAMMOGRAM WITH TOMO AND CAD

[R MLO synth-2D]
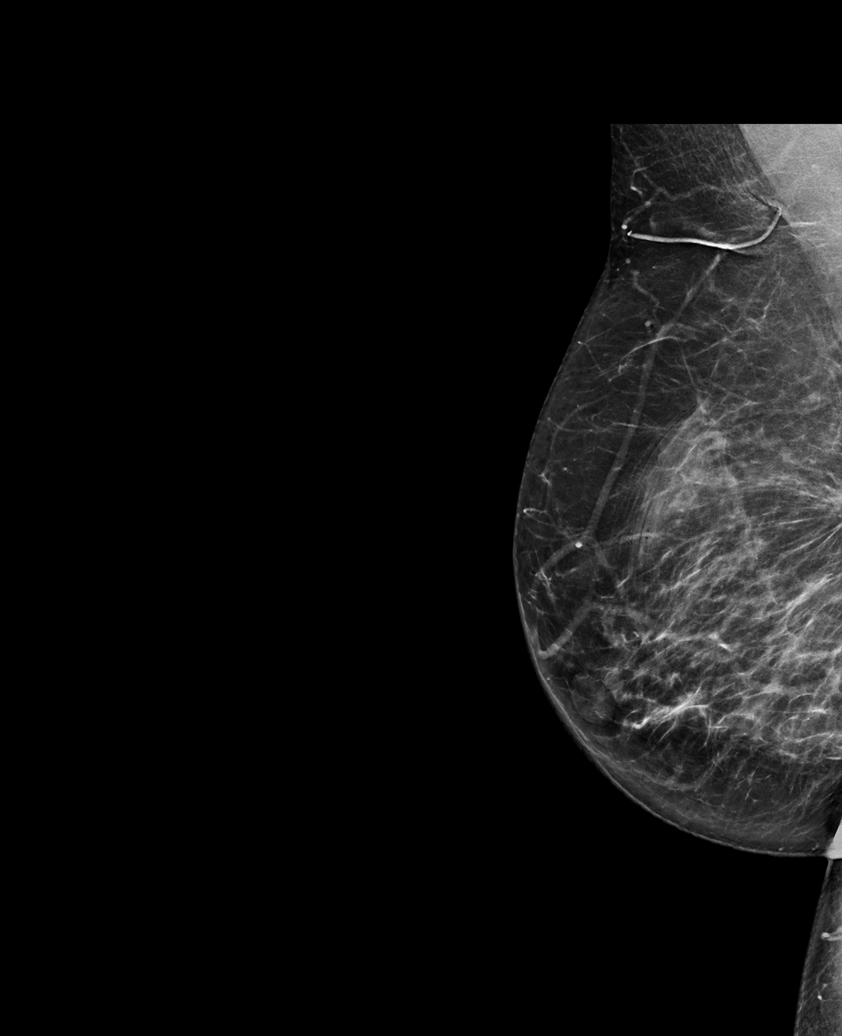

[L MLO synth-2D]
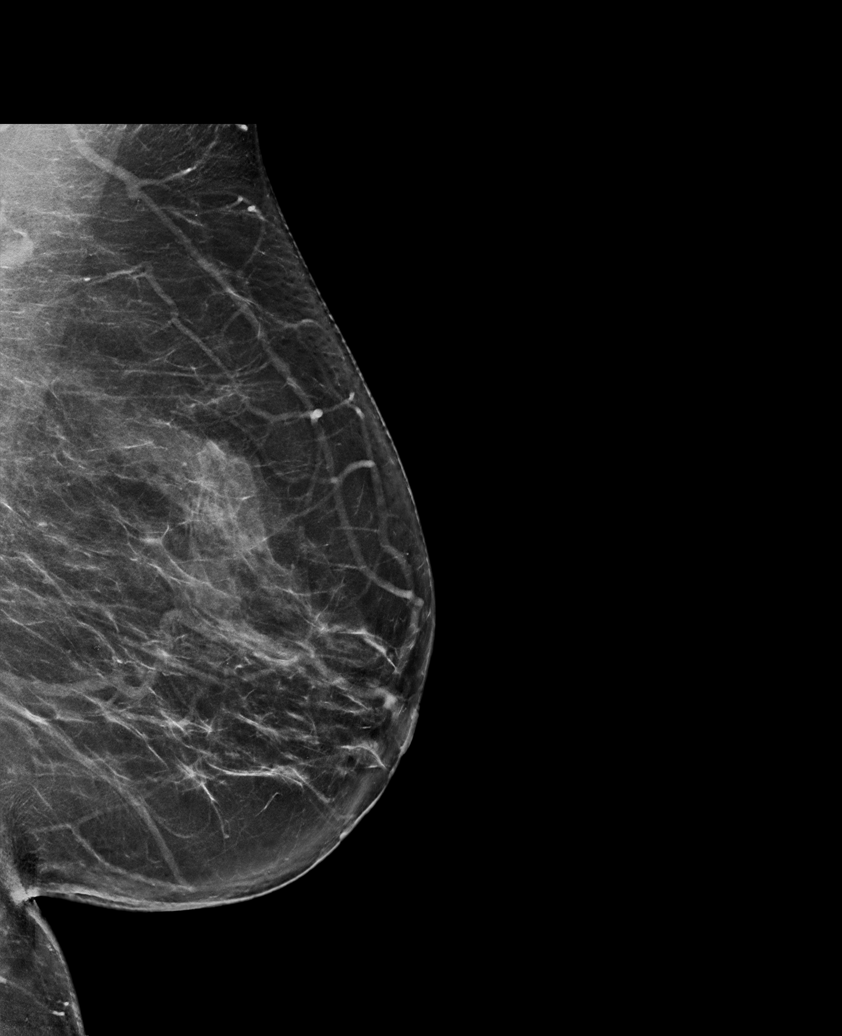

[R CC synth-2D (1 of 2)]
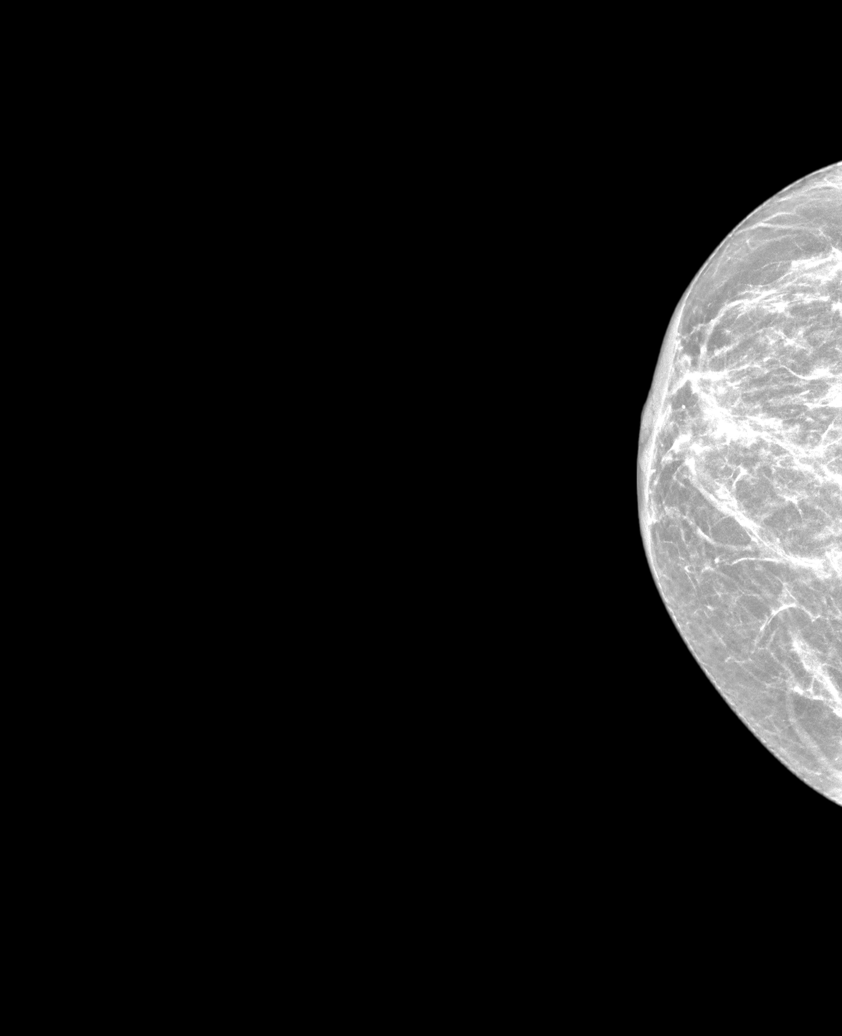

[L CC synth-2D]
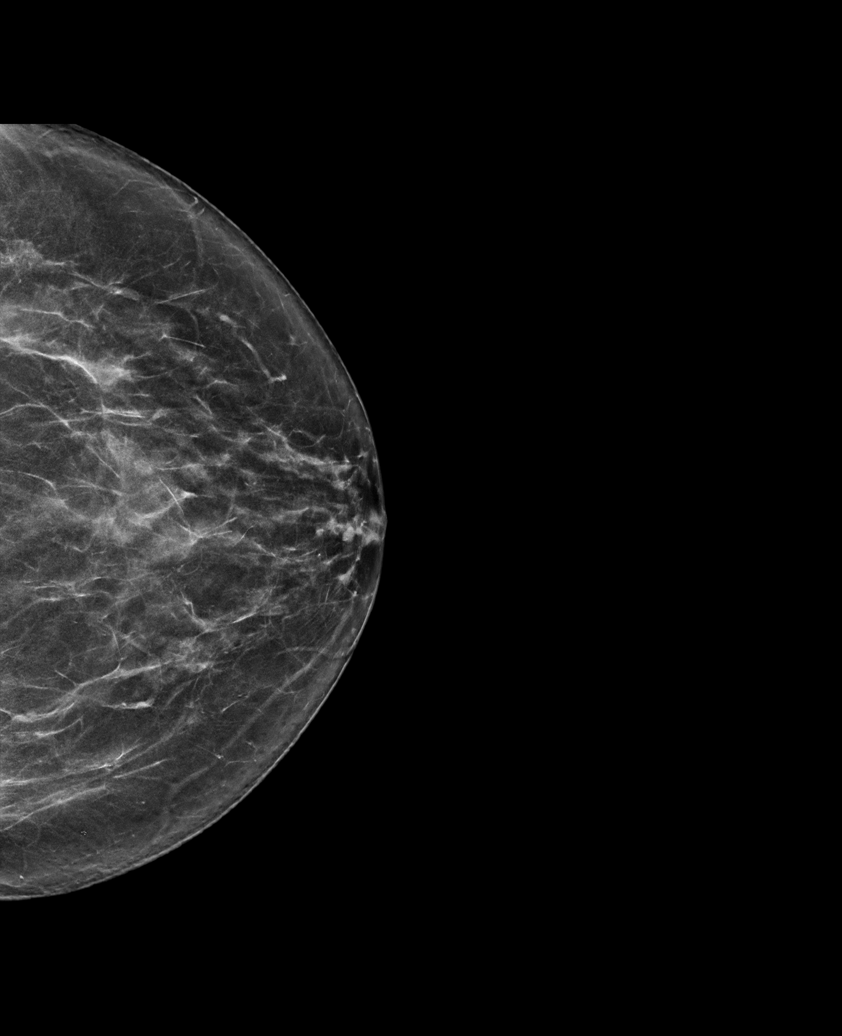

[R CC synth-2D (2 of 2)]
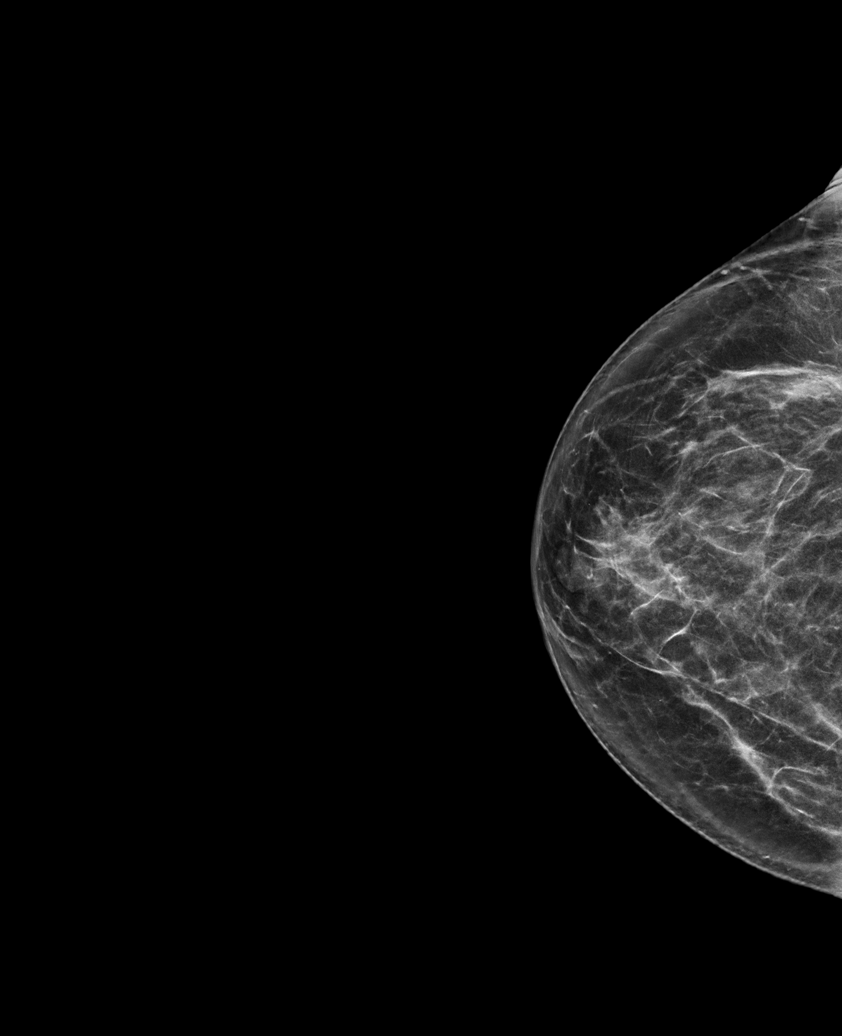

[R MLO tomo · tomo slice 36/71.0]
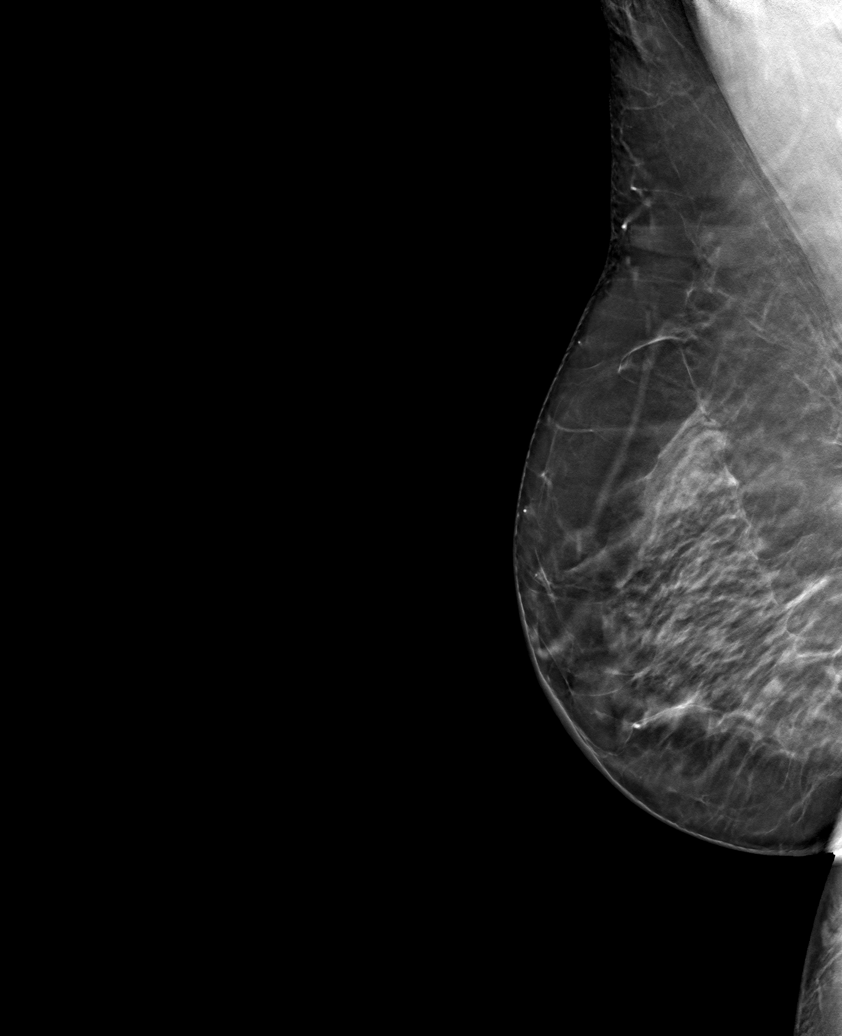

[6 of 30 positions shown; findings below may reference images not displayed]

ACR Breast Density Category b: There are scattered areas of
fibroglandular density.
FINDINGS: There are no findings suspicious for malignancy.

RIGHT lumpectomy changes again noted.

Images were processed with CAD.
IMPRESSION: No mammographic evidence of malignancy. A result letter of this
screening mammogram will be mailed directly to the patient.

RECOMMENDATION:
Screening mammogram in one year. (Code:I1-2-DUD)

BI-RADS CATEGORY  2: Benign.

## 2019-10-13 ENCOUNTER — Telehealth: Payer: Self-pay | Admitting: Adult Health

## 2019-10-13 ENCOUNTER — Other Ambulatory Visit: Payer: Self-pay | Admitting: Family

## 2019-10-13 DIAGNOSIS — Z1231 Encounter for screening mammogram for malignant neoplasm of breast: Secondary | ICD-10-CM

## 2019-10-13 NOTE — Telephone Encounter (Signed)
I talk with patient regarding moved appt from 12/7 to 12/10

## 2019-11-16 ENCOUNTER — Encounter: Payer: Self-pay | Admitting: Adult Health

## 2019-11-19 ENCOUNTER — Inpatient Hospital Stay: Payer: Medicare Other | Attending: Adult Health | Admitting: Adult Health

## 2019-11-19 ENCOUNTER — Encounter: Payer: Self-pay | Admitting: Adult Health

## 2019-11-19 ENCOUNTER — Other Ambulatory Visit: Payer: Self-pay

## 2019-11-19 ENCOUNTER — Telehealth: Payer: Self-pay | Admitting: Adult Health

## 2019-11-19 VITALS — BP 156/79 | HR 78 | Temp 98.3°F | Resp 16 | Ht 62.0 in | Wt 174.9 lb

## 2019-11-19 DIAGNOSIS — C50511 Malignant neoplasm of lower-outer quadrant of right female breast: Secondary | ICD-10-CM | POA: Diagnosis not present

## 2019-11-19 DIAGNOSIS — Z853 Personal history of malignant neoplasm of breast: Secondary | ICD-10-CM | POA: Diagnosis not present

## 2019-11-19 DIAGNOSIS — I10 Essential (primary) hypertension: Secondary | ICD-10-CM | POA: Diagnosis not present

## 2019-11-19 DIAGNOSIS — Z923 Personal history of irradiation: Secondary | ICD-10-CM | POA: Diagnosis not present

## 2019-11-19 NOTE — Progress Notes (Signed)
CLINIC:  Survivorship   REASON FOR VISIT:  Routine follow-up for history of breast cancer.   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 The invasive disease was estrogen receptor and HER-2 positive, progesterone receptor negative.  (3) She had subsequent sentinel lymph node sampling  December of 2012, with all 4 sentinel lymph nodes negative, for a final stage of T1a N0 or stage IA   (4) radiation therapy completed March 2013  (5) tamoxifen started 04/09/2012, stopped after approximately 6 weeks due to dizziness and multiple other symptoms, which eventually resolved.  Followed with observation alone since that time.    INTERVAL HISTORY:  Penny Foster presents to the Survivorship Clinic today for routine follow-up for her history of breast cancer.  Overall, she reports feeling quite well.   She is scheduled for mammogram on 12/02/2019.  She is walking regularly and remains active.  She is seeing Dr. Rosana Hoes in [facility] who is retiring later this month.  She is transitioning to Dr. Heber Waterville who is in the same practice.  She continues to be up to date with colon/gyn cancer screenings.    Penny Foster is retired and is practicing pandemic precautions.  She is living at home with her husband.   REVIEW OF SYSTEMS:  Review of Systems  Constitutional: Negative for appetite change, chills, fatigue, fever and unexpected weight change.  HENT:   Negative for hearing loss, lump/mass, mouth sores and trouble swallowing.   Eyes: Negative for eye problems and icterus.  Respiratory: Negative for chest tightness, cough and shortness of breath.   Cardiovascular: Negative for chest pain, leg swelling and palpitations.  Gastrointestinal: Negative for  abdominal distention, abdominal pain, constipation, diarrhea, nausea and vomiting.  Endocrine: Negative for hot flashes.  Genitourinary: Negative for difficulty urinating.   Musculoskeletal: Negative for arthralgias.  Skin: Negative for itching and rash.  Neurological: Negative for dizziness, extremity weakness, headaches and numbness.  Hematological: Negative for adenopathy. Does not bruise/bleed easily.  Psychiatric/Behavioral: Negative for depression. The patient is not nervous/anxious.   Breast: Denies any new nodularity, masses, tenderness, nipple changes, or nipple discharge.       PAST MEDICAL/SURGICAL HISTORY:  Past Medical History:  Diagnosis Date  . Breast cancer ([place]) 10/25/11   R breast bx- DCIS, ER/PR+  . Complication of anesthesia   . Hx of radiation therapy 01/08/12 to 02/20/12   R breast  . Hypertension   . Meniere disease   . Personal history of radiation therapy   . PONV (postoperative nausea and vomiting)    Past Surgical History:  Procedure Laterality Date  . APPENDECTOMY    . BREAST LUMPECTOMY  11/15/2011   Procedure: LUMPECTOMY;  Surgeon: Rolm Bookbinder, MD;  Location: [place];  Service: General;  Laterality: Right;  right breast wire guided lumpectomy , bracketed  . BREAST SURGERY     right axillary sentinel node biopsy  . CHOLECYSTECTOMY    . INNER EAR SURGERY       ALLERGIES:  Allergies  Allergen Reactions  . Levofloxacin Nausea Only    Other reaction(s): NAUSEA   . Penicillins Nausea And Vomiting    Has patient had a PCN reaction causing immediate rash, facial/tongue/throat swelling, SOB or lightheadedness with hypotension: No Has patient had a PCN reaction  causing severe rash involving mucus membranes or skin necrosis: No Has patient had a PCN reaction that required hospitalization: No Has patient had a PCN reaction occurring within the last 10 years: No If all of the above answers are "NO", then may proceed with  Cephalosporin use.      CURRENT MEDICATIONS:  Outpatient Encounter Medications as of 11/19/2019  Medication Sig Note  . diltiazem (CARDIZEM CD) 180 MG 24 hr capsule Take 180 mg by mouth daily.  02/24/2014: Received from: External Pharmacy  . gabapentin (NEURONTIN) 100 MG capsule Take 100 mg by mouth every evening.   . hydrochlorothiazide (HYDRODIURIL) 25 MG tablet Take 25 mg by mouth daily.   Marland Kitchen KLOR-CON M20 20 MEQ tablet Take 40-60 mEq by mouth See admin instructions. Take 67mq by mouth in the morning and 460m at night.   . Marland KitchenORazepam (ATIVAN) 1 MG tablet TAKE 1 TABLET (1 MG TOTAL) BY MOUTH EVERY EIGHT (8) HOURS AS NEEDED FOR ANXIETY.   . meclizine (ANTIVERT) 25 MG tablet Take 25 mg by mouth 2 (two) times daily as needed for dizziness.   . ondansetron (ZOFRAN ODT) 4 MG disintegrating tablet Take 1 tablet (4 mg total) by mouth every 8 (eight) hours as needed for nausea or vomiting.    No facility-administered encounter medications on file as of 11/19/2019.     ONCOLOGIC FAMILY HISTORY:  Family History  Problem Relation Age of Onset  . Cancer Sister        stomach  . Cancer Brother        lung    GENETIC COUNSELING/TESTING: Not at this time  SOCIAL HISTORY:  Social History   Socioeconomic History  . Marital status: Married    Spouse name: Not on file  . Number of children: Not on file  . Years of education: Not on file  . Highest education level: Not on file  Occupational History  . Occupation: AdHydrographic surveyorTobacco Use  . Smoking status: Never Smoker  . Smokeless tobacco: Never Used  Substance and Sexual Activity  . Alcohol use: No  . Drug use: No  . Sexual activity: Not on file  Other Topics Concern  . Not on file  Social History Narrative  . Not on file   Social Determinants of Health   Financial Resource Strain:   . Difficulty of Paying Living Expenses: Not on file  Food Insecurity:   . Worried About RuCharity fundraisern the Last Year: Not  on file  . Ran Out of Food in the Last Year: Not on file  Transportation Needs:   . Lack of Transportation (Medical): Not on file  . Lack of Transportation (Non-Medical): Not on file  Physical Activity:   . Days of Exercise per Week: Not on file  . Minutes of Exercise per Session: Not on file  Stress:   . Feeling of Stress : Not on file  Social Connections:   . Frequency of Communication with Friends and Family: Not on file  . Frequency of Social Gatherings with Friends and Family: Not on file  . Attends Religious Services: Not on file  . Active Member of Clubs or Organizations: Not on file  . Attends ClArchivisteetings: Not on file  . Marital Status: Not on file  Intimate Partner Violence:   . Fear of Current or Ex-Partner: Not on file  . Emotionally Abused: Not on file  . Physically Abused: Not on file  . Sexually Abused: Not on  file      PHYSICAL EXAMINATION:  Vital Signs: Vitals:   11/19/19 1438  BP: (!) 156/79  Pulse: 78  Resp: 16  Temp: 98.3 F (36.8 C)  SpO2: 99%   Filed Weights   11/19/19 1438  Weight: 174 lb 14.4 oz (79.3 kg)   General: Well-nourished, well-appearing female in no acute distress.  Unaccompanied today.   HEENT: Head is normocephalic.  Pupils equal and reactive to light. Conjunctivae clear without exudate.  Sclerae anicteric. Oral mucosa is pink, moist.  Oropharynx is pink without lesions or erythema.  Lymph: No cervical, supraclavicular, or infraclavicular lymphadenopathy noted on palpation.  Cardiovascular: Regular rate and rhythm.Marland Kitchen Respiratory: Clear to auscultation bilaterally. Chest expansion symmetric; breathing non-labored.  Breast Exam:  -Left breast: No appreciable masses on palpation. No skin redness, thickening, or peau d'orange appearance; no nipple retraction or nipple discharge;   -Right breast: No appreciable masses on palpation. No skin redness, thickening, or peau d'orange appearance; no nipple retraction or nipple  discharge; mild distortion in symmetry at previous lumpectomy site well healed scar without erythema or nodularity. -Axilla: No axillary adenopathy bilaterally.  GI: Abdomen soft and round; non-tender, non-distended. Bowel sounds normoactive. No hepatosplenomegaly.   GU: Deferred.  Neuro: No focal deficits. Steady gait.  Psych: Mood and affect normal and appropriate for situation.  MSK: No focal spinal tenderness to palpation, full range of motion in bilateral upper extremities Extremities: No edema. Skin: Warm and dry.  LABORATORY DATA:  None for this visit   DIAGNOSTIC IMAGING:  Most recent mammogram: scheduled for 12/02/2019    ASSESSMENT AND PLAN:  Ms.. Foster is a pleasant 68 y.o. 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  She presents to the Survivorship Clinic for surveillance and routine follow-up.   1. History of breast cancer:  Penny Foster is currently clinically and radiographically without evidence of disease or recurrence of breast cancer. She will be due for mammogram in 11/2019.  She would like to return in 1 year (13 months to allow time to see her after her mammogram) for f/u and surveillance of her breast cancer. We are happy to see her back.  I encouraged her to call me with any questions or concerns before her next visit at the cancer center, and I would be happy to see her sooner, if needed.    2. Bone health:  Given Penny Foster's age, history of breast cancer, she is at risk for bone demineralization. She was given education on specific food and activities to promote bone health.  3. Cancer screening:  Due to Penny Foster's history and her age, she should receive screening for skin cancers, colon cancer, and gynecologic cancers. She was encouraged to follow-up with her PCP for appropriate cancer  screenings.   4. Health maintenance and wellness promotion: Penny Foster was encouraged to consume 5-7 servings of fruits and vegetables per day. She was also encouraged to engage in moderate to vigorous exercise for 30 minutes per day most days of the week. She was instructed to limit her alcohol consumption and continue to abstain from tobacco use.    Dispo:  -Return to cancer center in 13 months for LTS follow up -Mammogram in 11/2019   A total of (15) minutes of face-to-face time was spent with this patient with greater than 50% of that time in counseling and care-coordination.  Gardenia Phlegm, NP Survivorship Program [place] [phone]   Note: PRIMARY CARE PROVIDER System, Pcp Not In None None

## 2019-11-19 NOTE — Telephone Encounter (Signed)
Sent to HIM to mail out letter pt request

## 2019-12-02 ENCOUNTER — Other Ambulatory Visit: Payer: Self-pay

## 2019-12-02 ENCOUNTER — Ambulatory Visit
Admission: RE | Admit: 2019-12-02 | Discharge: 2019-12-02 | Disposition: A | Discharge status: Home / Self Care | Payer: Medicare Other | Source: Ambulatory Visit | Attending: Family | Admitting: Family

## 2019-12-02 DIAGNOSIS — Z1231 Encounter for screening mammogram for malignant neoplasm of breast: Secondary | ICD-10-CM

## 2019-12-23 ENCOUNTER — Ambulatory Visit: Payer: No Typology Code available for payment source | Admitting: Adult Health

## 2020-09-13 ENCOUNTER — Other Ambulatory Visit: Payer: Self-pay | Admitting: Internal Medicine

## 2020-09-13 DIAGNOSIS — Z1231 Encounter for screening mammogram for malignant neoplasm of breast: Secondary | ICD-10-CM

## 2020-12-05 ENCOUNTER — Ambulatory Visit
Admission: RE | Admit: 2020-12-05 | Discharge: 2020-12-05 | Disposition: A | Discharge status: Home / Self Care | Payer: Medicare Other | Source: Ambulatory Visit | Attending: Internal Medicine | Admitting: Internal Medicine

## 2020-12-05 ENCOUNTER — Other Ambulatory Visit: Payer: Self-pay

## 2020-12-05 DIAGNOSIS — Z1231 Encounter for screening mammogram for malignant neoplasm of breast: Secondary | ICD-10-CM

## 2020-12-12 ENCOUNTER — Inpatient Hospital Stay: Payer: Medicare Other | Attending: Adult Health | Admitting: Adult Health

## 2020-12-12 ENCOUNTER — Other Ambulatory Visit: Payer: Self-pay

## 2020-12-12 VITALS — BP 166/83 | HR 65 | Temp 96.6°F | Resp 17 | Ht 62.0 in | Wt 174.8 lb

## 2020-12-12 DIAGNOSIS — Z800 Family history of malignant neoplasm of digestive organs: Secondary | ICD-10-CM | POA: Diagnosis not present

## 2020-12-12 DIAGNOSIS — C50411 Malignant neoplasm of upper-outer quadrant of right female breast: Secondary | ICD-10-CM | POA: Diagnosis not present

## 2020-12-12 DIAGNOSIS — Z853 Personal history of malignant neoplasm of breast: Secondary | ICD-10-CM | POA: Diagnosis not present

## 2020-12-12 DIAGNOSIS — Z801 Family history of malignant neoplasm of trachea, bronchus and lung: Secondary | ICD-10-CM | POA: Diagnosis not present

## 2020-12-12 DIAGNOSIS — Z170 Estrogen receptor positive status [ER+]: Secondary | ICD-10-CM

## 2020-12-12 NOTE — Progress Notes (Signed)
CLINIC:  Survivorship   REASON FOR VISIT:  Routine follow-up for history of breast cancer.   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 The invasive disease was estrogen receptor and HER-2 positive, progesterone receptor negative.  (3) She had subsequent sentinel lymph node sampling  December of 2012, with all 4 sentinel lymph nodes negative, for a final stage of T1a N0 or stage IA   (4) radiation therapy completed March 2013  (5) tamoxifen started 04/09/2012, stopped after approximately 6 weeks due to dizziness and multiple other symptoms, which eventually resolved.  Followed with observation alone since that time.    INTERVAL HISTORY:  Penny Foster presents to the Survivorship Clinic today for routine follow-up for her history of breast cancer.  Overall, she reports feeling quite well.   Penny underwent a bilatearl breast mammogram on 12/06/2020 that showed no evidence of malignancy and breast density category B.  She is doing well and tells me she has no concerns.  She is walking when she can and she is up to date with her PCP visits and health maintenance.   REVIEW OF SYSTEMS:  Review of Systems  Constitutional: Negative for appetite change, chills, fatigue, fever and unexpected weight change.  HENT:   Negative for hearing loss, lump/mass, mouth sores and trouble swallowing.   Eyes: Negative for eye problems and icterus.  Respiratory: Negative for chest tightness, cough and shortness of breath.   Cardiovascular: Negative for chest pain, leg swelling and palpitations.  Gastrointestinal: Negative for abdominal distention, abdominal pain, constipation, diarrhea, nausea and vomiting.  Endocrine: Negative for hot flashes.   Genitourinary: Negative for difficulty urinating.   Musculoskeletal: Negative for arthralgias.  Skin: Negative for itching and rash.  Neurological: Negative for dizziness, extremity weakness, headaches and numbness.  Hematological: Negative for adenopathy. Does not bruise/bleed easily.  Psychiatric/Behavioral: Negative for depression. The patient is not nervous/anxious.   Breast: Denies any new nodularity, masses, tenderness, nipple changes, or nipple discharge.       PAST MEDICAL/SURGICAL HISTORY:  Past Medical History:  Diagnosis Date  . Breast cancer ([place]) 10/25/11   R breast bx- DCIS, ER/PR+  . Complication of anesthesia   . Hx of radiation therapy 01/08/12 to 02/20/12   R breast  . Hypertension   . Meniere disease   . Personal history of radiation therapy   . PONV (postoperative nausea and vomiting)    Past Surgical History:  Procedure Laterality Date  . APPENDECTOMY    . BREAST LUMPECTOMY  11/15/2011   Procedure: LUMPECTOMY;  Surgeon: Rolm Bookbinder, MD;  Location: [place];  Service: General;  Laterality: Right;  right breast wire guided lumpectomy , bracketed  . BREAST SURGERY     right axillary sentinel node biopsy  . CHOLECYSTECTOMY    . INNER EAR SURGERY       ALLERGIES:  Allergies  Allergen Reactions  . Levofloxacin Nausea Only    Other reaction(s): NAUSEA   . Penicillins Nausea And Vomiting    Has patient had a PCN reaction causing immediate rash, facial/tongue/throat swelling, SOB or lightheadedness with hypotension: No Has patient had a PCN reaction causing severe rash involving mucus membranes or skin necrosis: No Has patient had a PCN reaction that required hospitalization: No Has patient had a  PCN reaction occurring within the last 10 years: No If all of the above answers are "NO", then may proceed with Cephalosporin use.      CURRENT MEDICATIONS:  Outpatient Encounter Medications as of 12/12/2020  Medication Sig Note  .  diltiazem (CARDIZEM CD) 180 MG 24 hr capsule Take 180 mg by mouth daily.  02/24/2014: Received from: External Pharmacy  . gabapentin (NEURONTIN) 100 MG capsule Take 100 mg by mouth every evening.   . hydrochlorothiazide (HYDRODIURIL) 25 MG tablet Take 25 mg by mouth daily.   Marland Kitchen KLOR-CON M20 20 MEQ tablet Take 40-60 mEq by mouth See admin instructions. Take 53mq by mouth in the morning and 484m at night.   . Marland KitchenORazepam (ATIVAN) 1 MG tablet TAKE 1 TABLET (1 MG TOTAL) BY MOUTH EVERY EIGHT (8) HOURS AS NEEDED FOR ANXIETY.   . meclizine (ANTIVERT) 25 MG tablet Take 25 mg by mouth 2 (two) times daily as needed for dizziness.   . ondansetron (ZOFRAN ODT) 4 MG disintegrating tablet Take 1 tablet (4 mg total) by mouth every 8 (eight) hours as needed for nausea or vomiting.    No facility-administered encounter medications on file as of 12/12/2020.     ONCOLOGIC FAMILY HISTORY:  Family History  Problem Relation Age of Onset  . Cancer Sister        stomach  . Cancer Brother        lung    GENETIC COUNSELING/TESTING: Not at this time  SOCIAL HISTORY:  Social History   Socioeconomic History  . Marital status: Married    Spouse name: Not on file  . Number of children: Not on file  . Years of education: Not on file  . Highest education level: Not on file  Occupational History  . Occupation: AdHydrographic surveyorTobacco Use  . Smoking status: Never Smoker  . Smokeless tobacco: Never Used  Vaping Use  . Vaping Use: Never used  Substance and Sexual Activity  . Alcohol use: No  . Drug use: No  . Sexual activity: Not on file  Other Topics Concern  . Not on file  Social History Narrative  . Not on file   Social Determinants of Health   Financial Resource Strain: Not on file  Food Insecurity: Not on file  Transportation Needs: Not on file  Physical Activity: Not on file  Stress: Not on file  Social Connections: Not on file  Intimate Partner Violence: Not on file       PHYSICAL EXAMINATION:  Vital Signs: Vitals:   12/12/20 1359  BP: (!) 166/83  Pulse: 65  Resp: 17  Temp: (!) 96.6 F (35.9 C)  SpO2: 99%   Filed Weights   12/12/20 1359  Weight: 174 lb 12.8 oz (79.3 kg)   General: Well-nourished, well-appearing female in no acute distress.  Unaccompanied today.   HEENT: Head is normocephalic.  Pupils equal and reactive to light. Conjunctivae clear without exudate.  Sclerae anicteric.Mask in place Lymph: No cervical, supraclavicular, or infraclavicular lymphadenopathy noted on palpation.  Cardiovascular: Regular rate and rhythm.. Marland Kitchenespiratory: Clear to auscultation bilaterally. Chest expansion symmetric; breathing non-labored.  Breast Exam:  -Left breast: No appreciable masses on palpation. No skin redness, thickening, or peau d'orange appearance; no nipple retraction or nipple discharge;   -Right breast: No appreciable masses on palpation. No skin redness, thickening, or peau d'orange appearance; no nipple retraction or nipple discharge; mild distortion in symmetry at previous lumpectomy site well healed scar without erythema or nodularity. -Axilla: No  axillary adenopathy bilaterally.  GI: Abdomen soft and round; non-tender, non-distended. Bowel sounds normoactive. No hepatosplenomegaly.   GU: Deferred.  Neuro: No focal deficits. Steady gait.  Psych: Mood and affect normal and appropriate for situation.  MSK: No focal spinal tenderness to palpation, full range of motion in bilateral upper extremities Extremities: No edema. Skin: Warm and dry.  LABORATORY DATA:  None for this visit   DIAGNOSTIC IMAGING:  Most recent mammogram:  CLINICAL DATA:  Screening. History of treated right breast DCIS, status post lumpectomy and radiation therapy in 2012.  EXAM: DIGITAL SCREENING BILATERAL MAMMOGRAM WITH TOMO AND CAD  COMPARISON:  Previous exam(s).  ACR Breast Density Category b: There are scattered areas of fibroglandular  density.  FINDINGS: There are no findings suspicious for malignancy. Stable postsurgical changes in the right breast. Images were processed with CAD.  IMPRESSION: No mammographic evidence of malignancy. A result letter of this screening mammogram will be mailed directly to the patient.  RECOMMENDATION: Screening mammogram in one year. (Code:SM-B-01Y)  BI-RADS CATEGORY  1: Negative.   Electronically Signed   By: Fidela Salisbury M.D.   On: 12/06/2020 12:41    ASSESSMENT AND PLAN:  Ms.. Foster is a pleasant 70 y.o. 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  She presents to the Survivorship Clinic for surveillance and routine follow-up.   1. History of breast cancer:  Penny Foster is currently clinically and radiographically without evidence of disease or recurrence of breast cancer. She will be due for mammogram in 11/2021.  She will return in one year for continued follow up and surveillance.I encouraged her to call me with any questions or concerns before her next visit at the cancer center, and I would be happy to see her sooner, if needed.    2. Bone health:  Given Penny Foster's age, history of breast cancer, she is at risk for bone demineralization. She was given education on specific food and activities to promote bone health.  3. Cancer screening:  Due to Penny Foster's history and her age, she should receive screening for skin cancers, colon cancer, and gynecologic cancers. She was encouraged to follow-up with her PCP for appropriate cancer screenings.   4. Health maintenance and wellness promotion: Penny Foster was encouraged to consume 5-7 servings of fruits and vegetables per day. She was also encouraged to engage in moderate to vigorous exercise for 30 minutes per day most days of the week. She was  instructed to limit her alcohol consumption and continue to abstain from tobacco use.    Dispo:  -Return to cancer center in 1 year for LTS f/u -Mammogram due in 11/2021  Total encounter time: 20 minutes*  Wilber Bihari, NP 12/16/20 10:07 AM Medical Oncology and Hematology [facility] [place], [place] [address] Tel. [phone]    Fax. [phone]  *Total Encounter Time as defined by the Centers for Medicare and Medicaid Services includes, in addition to the face-to-face time of a patient visit (documented in the note above) non-face-to-face time: obtaining and reviewing outside history, ordering and reviewing medications, tests or procedures, care coordination (communications with other health care professionals or caregivers) and documentation in the medical record.

## 2020-12-16 ENCOUNTER — Encounter: Payer: Self-pay | Admitting: Adult Health

## 2021-02-24 ENCOUNTER — Telehealth: Payer: Self-pay | Admitting: Adult Health

## 2021-02-24 NOTE — Telephone Encounter (Signed)
Called pt per 3/17 sch msg - left message for patient to call back to schedule appt.

## 2021-05-01 ENCOUNTER — Other Ambulatory Visit: Payer: Self-pay

## 2021-05-01 ENCOUNTER — Encounter (HOSPITAL_COMMUNITY): Payer: Self-pay

## 2021-05-01 ENCOUNTER — Emergency Department (HOSPITAL_COMMUNITY)
Admission: EM | Admit: 2021-05-01 | Discharge: 2021-05-01 | Disposition: A | Discharge status: Home / Self Care | Payer: Medicare Other | Attending: Emergency Medicine | Admitting: Emergency Medicine

## 2021-05-01 DIAGNOSIS — Z853 Personal history of malignant neoplasm of breast: Secondary | ICD-10-CM | POA: Diagnosis not present

## 2021-05-01 DIAGNOSIS — Z79899 Other long term (current) drug therapy: Secondary | ICD-10-CM | POA: Diagnosis not present

## 2021-05-01 DIAGNOSIS — I10 Essential (primary) hypertension: Secondary | ICD-10-CM | POA: Insufficient documentation

## 2021-05-01 DIAGNOSIS — R0981 Nasal congestion: Secondary | ICD-10-CM

## 2021-05-01 DIAGNOSIS — R110 Nausea: Secondary | ICD-10-CM

## 2021-05-01 DIAGNOSIS — R112 Nausea with vomiting, unspecified: Secondary | ICD-10-CM | POA: Insufficient documentation

## 2021-05-01 LAB — CBC WITH DIFFERENTIAL/PLATELET
Abs Immature Granulocytes: 0.02 10*3/uL (ref 0.00–0.07)
Basophils Absolute: 0.1 10*3/uL (ref 0.0–0.1)
Basophils Relative: 1 %
Eosinophils Absolute: 0.1 10*3/uL (ref 0.0–0.5)
Eosinophils Relative: 1 %
HCT: 43.1 % (ref 36.0–46.0)
Hemoglobin: 14.2 g/dL (ref 12.0–15.0)
Immature Granulocytes: 0 %
Lymphocytes Relative: 34 %
Lymphs Abs: 3.4 10*3/uL (ref 0.7–4.0)
MCH: 28.7 pg (ref 26.0–34.0)
MCHC: 32.9 g/dL (ref 30.0–36.0)
MCV: 87.2 fL (ref 80.0–100.0)
Monocytes Absolute: 0.6 10*3/uL (ref 0.1–1.0)
Monocytes Relative: 6 %
Neutro Abs: 5.9 10*3/uL (ref 1.7–7.7)
Neutrophils Relative %: 58 %
Platelets: 420 10*3/uL — ABNORMAL HIGH (ref 150–400)
RBC: 4.94 MIL/uL (ref 3.87–5.11)
RDW: 13.6 % (ref 11.5–15.5)
WBC: 10.1 10*3/uL (ref 4.0–10.5)
nRBC: 0 % (ref 0.0–0.2)

## 2021-05-01 LAB — COMPREHENSIVE METABOLIC PANEL
ALT: 13 U/L (ref 0–44)
AST: 18 U/L (ref 15–41)
Albumin: 4 g/dL (ref 3.5–5.0)
Alkaline Phosphatase: 74 U/L (ref 38–126)
Anion gap: 11 (ref 5–15)
BUN: 12 mg/dL (ref 8–23)
CO2: 23 mmol/L (ref 22–32)
Calcium: 9.1 mg/dL (ref 8.9–10.3)
Chloride: 106 mmol/L (ref 98–111)
Creatinine, Ser: 0.97 mg/dL (ref 0.44–1.00)
GFR, Estimated: >60 mL/min (ref >60)
Glucose, Bld: 88 mg/dL (ref 70–99)
Potassium: 3.6 mmol/L (ref 3.5–5.1)
Sodium: 140 mmol/L (ref 135–145)
Total Bilirubin: 1 mg/dL (ref 0.3–1.2)
Total Protein: 7.9 g/dL (ref 6.5–8.1)

## 2021-05-01 LAB — LIPASE, BLOOD: Lipase: 24 U/L (ref 11–51)

## 2021-05-01 MED ORDER — PROMETHAZINE HCL 12.5 MG PO TABS: 12.5000 mg | ORAL_TABLET | Freq: Four times a day (QID) | ORAL | 0 refills | Status: AC | PRN

## 2021-05-01 NOTE — ED Triage Notes (Signed)
Patient complains of nausea, decreased appetite and emesis with intake for 6 months. MD has told her related to booster shot, could be lyme disease. Patient alert and oriented, NAD. Has been seen by multiple MDs for same and requesting help with diagnosis

## 2021-05-01 NOTE — ED Provider Notes (Signed)
Emergency Medicine Provider Triage Evaluation Note  ANAGABRIELA JOKERST , a 70 y.o. female  was evaluated in triage.  Pt complains of nausea, decreased appetite, emesis for past 6 months.  However this could be due to Lyme disease as per PCP which she is unsure.  Reports generalized weakness and fatigue.  No chest pain.  Review of Systems  Positive: Fatigue, emesis, decreased appetite Negative: Chest pain  Physical Exam  BP (!) 149/85 (BP Location: Right Arm)   Pulse 73   Temp 98.8 F (37.1 C) (Oral)   Resp 18   SpO2 100%  Gen:   Awake, no distress   Resp:  Normal effort  MSK:   Moves extremities without difficulty  Other:  Abdomen is soft  Medical Decision Making  Medically screening exam initiated at 3:52 PM.  Appropriate orders placed.  SARAH BAEZ was informed that the remainder of the evaluation will be completed by another provider, this initial triage assessment does not replace that evaluation, and the importance of remaining in the ED until their evaluation is complete.  Labs ekg ordered   Delia Heady, PA-C 05/01/21 [place], [place], DO 05/01/21 1615

## 2021-05-01 NOTE — ED Provider Notes (Signed)
[facility] Provider Note   CSN: [id number] Arrival date & time: 05/01/21  1419     History No chief complaint on file.   Penny Foster is a 70 y.o. female.  Patient reports that she has been feeling bad for the past 6 months patient reports she has had sinus congestion and discomfort patient reports that she is nauseated every day and has vomiting.  Patient reports that she had a primary care physician that she was seeing who told her that she had Lyme disease and treated her with doxycycline.  Dates that her physician then told her that she did not have Lyme disease.  She reports she became frustrated and no longer trusted this physician so she established care with a new provider.  Patient reports she has been referred to an ENT and ENT referred her for a CT scan patient does not have the results as this test was done today.  Patient reports she came to the emergency department tonight because she is tired of being sick.  Wants evaluation to find out what is wrong with her and some medicine to make it better.  The history is provided by the patient. No language interpreter was used.       Past Medical History:  Diagnosis Date  . 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 Complication of anesthesia   . Hx of radiation therapy 01/08/12 to 02/20/12   R breast  . Hypertension   . Meniere disease   . Personal history of radiation therapy   . PONV (postoperative nausea and vomiting)     Patient Active Problem List   Diagnosis Date Noted  . Hx of radiation therapy   . Hypertension   . Malignant neoplasm of upper-outer quadrant of right breast in female, estrogen receptor positive ([place]) 11/08/2011  . Cancer of lower-outer quadrant of female breast ([place]) 11/07/2011  . Breast cancer ([place]) 10/25/2011    Past Surgical History:  Procedure Laterality Date  . APPENDECTOMY    . BREAST LUMPECTOMY  11/15/2011   Procedure:  LUMPECTOMY;  Surgeon: Rolm Bookbinder, MD;  Location: [facility];  Service: General;  Laterality: Right;  right breast wire guided lumpectomy , bracketed  . BREAST SURGERY     right axillary sentinel node biopsy  . CHOLECYSTECTOMY    . INNER EAR SURGERY       OB History    Gravida  2   Para  2   Term      Preterm      AB      Living        SAB      IAB      Ectopic      Multiple      Live Births           Obstetric Comments  Menarche age 22, first pregnancy to term age 45. She is GXP2. She does not recall how long ago she stopped having periods. She never used hormone replacement or birth control pills        Family History  Problem Relation Age of Onset  . Cancer Sister        stomach  . Cancer Brother        lung    Social History   Tobacco Use  . Smoking status: Never Smoker  . Smokeless tobacco: Never Used  Vaping Use  . Vaping Use: Never used  Substance Use Topics  . Alcohol use: No  . Drug use: No    Home Medications Prior to Admission medications   Medication Sig Start Date End Date Taking? Authorizing Provider  diltiazem (CARDIZEM CD) 180 MG 24 hr capsule Take 180 mg by mouth daily.  02/12/14   [provider]  gabapentin (NEURONTIN) 100 MG capsule Take 100 mg by mouth every evening. 09/04/18   [provider]  hydrochlorothiazide (HYDRODIURIL) 25 MG tablet Take 25 mg by mouth daily. 10/03/18   [provider]  KLOR-CON M20 20 MEQ tablet Take 40-60 mEq by mouth See admin instructions. Take 90mq by mouth in the morning and 436m at night. 09/28/11   [provider]  LORazepam (ATIVAN) 1 MG tablet TAKE 1 TABLET (1 MG TOTAL) BY MOUTH EVERY EIGHT (8) HOURS AS NEEDED FOR ANXIETY. 10/03/18   [provider]  meclizine (ANTIVERT) 25 MG tablet Take 25 mg by mouth 2 (two) times daily as needed for dizziness.    [provider]  ondansetron (ZOFRAN ODT) 4 MG disintegrating tablet Take  1 tablet (4 mg total) by mouth every 8 (eight) hours as needed for nausea or vomiting. 09/29/18   Henderly, Britni A, PA-C    Allergies    Levofloxacin and Penicillins  Review of Systems   Review of Systems  Constitutional: Positive for activity change and appetite change.  HENT: Positive for congestion.   Respiratory: Positive for cough.   Gastrointestinal: Positive for nausea and vomiting.  Neurological: Positive for dizziness.  All other systems reviewed and are negative.   Physical Exam Updated Vital Signs BP (!) 159/97 (BP Location: Left Arm)   Pulse 67   Temp 98.8 F (37.1 C) (Oral)   Resp 18   SpO2 100%   Physical Exam Vitals and nursing note reviewed.  Constitutional:      Appearance: She is well-developed.  HENT:     Head: Normocephalic.     Mouth/Throat:     Mouth: Mucous membranes are moist.  Eyes:     Pupils: Pupils are equal, round, and reactive to light.  Cardiovascular:     Rate and Rhythm: Normal rate and regular rhythm.  Pulmonary:     Effort: Pulmonary effort is normal.  Abdominal:     General: There is no distension.  Musculoskeletal:        General: Normal range of motion.     Cervical back: Normal range of motion.  Skin:    General: Skin is warm.  Neurological:     General: No focal deficit present.     Mental Status: She is alert and oriented to person, place, and time.  Psychiatric:        Mood and Affect: Mood normal.     ED Results / Procedures / Treatments   Labs (all labs ordered are listed, but only abnormal results are displayed) Labs Reviewed  CBC WITH DIFFERENTIAL/PLATELET - Abnormal; Notable for the following components:      Result Value   Platelets 420 (*)    All other components within normal limits  COMPREHENSIVE METABOLIC PANEL  LIPASE, BLOOD    EKG None  Radiology No results found.  Procedures Procedures   Medications Ordered in ED Medications - No data to display  ED Course  I have reviewed the  triage vital signs and the nursing notes.  Pertinent labs & imaging results that were available during my care of the patient were reviewed by me and considered in my  medical decision making (see chart for details).    MDM Rules/Calculators/A&P                         MDM: Patient had a CBC and c-Met done here today which are normal with the exception of platelets of 420.  Patient's blood pressure is slightly elevated at 159/97 vital signs are otherwise normal.  EKG appears nonacute patient is not having any chest pain advised patient I do not think I would initiate any treatment until her CT scan of her sinuses from today is resulted.  Patient reports she has nausea medicine at home.  I advised patient to contact her provider tomorrow to discuss the results of her CT scan and to obtain further instructions treatment Final Clinical Impression(s) / ED Diagnoses Final diagnoses:  Nausea  Complaint of nasal congestion    Rx / DC Orders ED Discharge Orders    None    An After Visit Summary was printed and given to the patient.    Fransico Meadow, Hershal Coria 05/01/21 2058    Pattricia Boss, MD 05/01/21 [phone]

## 2021-10-30 ENCOUNTER — Other Ambulatory Visit: Payer: Self-pay | Admitting: Physician Assistant

## 2021-10-30 DIAGNOSIS — Z1231 Encounter for screening mammogram for malignant neoplasm of breast: Secondary | ICD-10-CM

## 2021-12-07 ENCOUNTER — Ambulatory Visit: Payer: Medicare Other

## 2022-05-18 ENCOUNTER — Ambulatory Visit
Admission: RE | Admit: 2022-05-18 | Discharge: 2022-05-18 | Disposition: A | Discharge status: Home / Self Care | Payer: Medicare Other | Source: Ambulatory Visit | Attending: Physician Assistant | Admitting: Physician Assistant

## 2022-05-18 DIAGNOSIS — Z1231 Encounter for screening mammogram for malignant neoplasm of breast: Secondary | ICD-10-CM

## 2023-06-17 ENCOUNTER — Other Ambulatory Visit: Payer: Self-pay | Admitting: Physician Assistant

## 2023-06-17 DIAGNOSIS — Z1231 Encounter for screening mammogram for malignant neoplasm of breast: Secondary | ICD-10-CM

## 2023-07-03 ENCOUNTER — Ambulatory Visit: Admission: RE | Admit: 2023-07-03 | Payer: Medicare Other | Source: Ambulatory Visit

## 2023-07-03 DIAGNOSIS — Z1231 Encounter for screening mammogram for malignant neoplasm of breast: Secondary | ICD-10-CM

## 2023-08-17 LAB — COLOGUARD: COLOGUARD: NEGATIVE

## 2024-06-17 ENCOUNTER — Other Ambulatory Visit: Payer: Self-pay | Admitting: Physician Assistant

## 2024-06-17 DIAGNOSIS — Z1231 Encounter for screening mammogram for malignant neoplasm of breast: Secondary | ICD-10-CM

## 2024-07-16 ENCOUNTER — Ambulatory Visit
Admission: RE | Admit: 2024-07-16 | Discharge: 2024-07-16 | Disposition: A | Discharge status: Home / Self Care | Source: Ambulatory Visit | Attending: Physician Assistant | Admitting: Physician Assistant

## 2024-07-16 DIAGNOSIS — Z1231 Encounter for screening mammogram for malignant neoplasm of breast: Secondary | ICD-10-CM
